# Patient Record
Sex: Female | Born: 1945 | Race: Black or African American | Hispanic: No | Marital: Single | Smoking: Former smoker
Health system: Southern US, Community
[De-identification: ages and names within clinical notes are randomized; demographics above are authoritative.]

## PROBLEM LIST (undated history)

## (undated) DIAGNOSIS — J069 Acute upper respiratory infection, unspecified: Secondary | ICD-10-CM

## (undated) DIAGNOSIS — K921 Melena: Secondary | ICD-10-CM

## (undated) DIAGNOSIS — J029 Acute pharyngitis, unspecified: Secondary | ICD-10-CM

## (undated) DIAGNOSIS — L03818 Cellulitis of other sites: Secondary | ICD-10-CM

## (undated) DIAGNOSIS — Z9889 Other specified postprocedural states: Secondary | ICD-10-CM

## (undated) DIAGNOSIS — E669 Obesity, unspecified: Secondary | ICD-10-CM

## (undated) DIAGNOSIS — E785 Hyperlipidemia, unspecified: Secondary | ICD-10-CM

## (undated) DIAGNOSIS — E119 Type 2 diabetes mellitus without complications: Secondary | ICD-10-CM

## (undated) DIAGNOSIS — R635 Abnormal weight gain: Secondary | ICD-10-CM

## (undated) DIAGNOSIS — M545 Low back pain: Secondary | ICD-10-CM

## (undated) DIAGNOSIS — L723 Sebaceous cyst: Secondary | ICD-10-CM

## (undated) DIAGNOSIS — Z8719 Personal history of other diseases of the digestive system: Secondary | ICD-10-CM

## (undated) DIAGNOSIS — L909 Atrophic disorder of skin, unspecified: Secondary | ICD-10-CM

## (undated) DIAGNOSIS — I10 Essential (primary) hypertension: Secondary | ICD-10-CM

## (undated) DIAGNOSIS — R51 Headache: Secondary | ICD-10-CM

## (undated) DIAGNOSIS — L02818 Cutaneous abscess of other sites: Secondary | ICD-10-CM

## (undated) DIAGNOSIS — D649 Anemia, unspecified: Secondary | ICD-10-CM

## (undated) DIAGNOSIS — J449 Chronic obstructive pulmonary disease, unspecified: Secondary | ICD-10-CM

## (undated) DIAGNOSIS — L919 Hypertrophic disorder of the skin, unspecified: Secondary | ICD-10-CM

## (undated) DIAGNOSIS — B373 Candidiasis of vulva and vagina: Secondary | ICD-10-CM

## (undated) DIAGNOSIS — B351 Tinea unguium: Secondary | ICD-10-CM

## (undated) HISTORY — DX: Melena: K92.1

## (undated) HISTORY — DX: Low back pain: M54.5

## (undated) HISTORY — DX: Acute pharyngitis, unspecified: J02.9

## (undated) HISTORY — DX: Sebaceous cyst: L72.3

## (undated) HISTORY — DX: Headache: R51

## (undated) HISTORY — DX: Tinea unguium: B35.1

## (undated) HISTORY — PX: BACK SURGERY: SHX140

## (undated) HISTORY — DX: Abnormal weight gain: R63.5

## (undated) HISTORY — DX: Other specified postprocedural states: Z98.890

## (undated) HISTORY — DX: Acute upper respiratory infection, unspecified: J06.9

## (undated) HISTORY — DX: Cutaneous abscess of other sites: L02.818

## (undated) HISTORY — DX: Hyperlipidemia, unspecified: E78.5

## (undated) HISTORY — DX: Cellulitis of other sites: L03.818

## (undated) HISTORY — DX: Type 2 diabetes mellitus without complications: E11.9

## (undated) HISTORY — DX: Candidiasis of vulva and vagina: B37.3

## (undated) HISTORY — DX: Obesity, unspecified: E66.9

## (undated) HISTORY — PX: ABDOMINAL HYSTERECTOMY: SHX81

## (undated) HISTORY — PX: EYE SURGERY: SHX253

## (undated) HISTORY — DX: Atrophic disorder of skin, unspecified: L90.9

## (undated) HISTORY — DX: Hypertrophic disorder of the skin, unspecified: L91.9

## (undated) HISTORY — DX: Personal history of other diseases of the digestive system: Z87.19

---

## 2001-10-28 ENCOUNTER — Ambulatory Visit (HOSPITAL_COMMUNITY): Admission: RE | Admit: 2001-10-28 | Discharge: 2001-10-28 | Payer: Self-pay | Admitting: *Deleted

## 2001-10-28 ENCOUNTER — Encounter: Payer: Self-pay | Admitting: *Deleted

## 2002-01-05 ENCOUNTER — Ambulatory Visit (HOSPITAL_COMMUNITY): Admission: RE | Admit: 2002-01-05 | Discharge: 2002-01-05 | Payer: Self-pay | Admitting: Family Medicine

## 2002-02-02 ENCOUNTER — Encounter: Payer: Self-pay | Admitting: *Deleted

## 2002-02-02 ENCOUNTER — Emergency Department (HOSPITAL_COMMUNITY): Admission: EM | Admit: 2002-02-02 | Discharge: 2002-02-02 | Payer: Self-pay | Admitting: *Deleted

## 2002-03-17 ENCOUNTER — Ambulatory Visit (HOSPITAL_COMMUNITY): Admission: RE | Admit: 2002-03-17 | Discharge: 2002-03-17 | Payer: Self-pay | Admitting: Internal Medicine

## 2002-03-17 ENCOUNTER — Encounter: Payer: Self-pay | Admitting: Internal Medicine

## 2002-04-21 ENCOUNTER — Encounter: Payer: Self-pay | Admitting: General Surgery

## 2002-04-28 ENCOUNTER — Inpatient Hospital Stay (HOSPITAL_COMMUNITY): Admission: RE | Admit: 2002-04-28 | Discharge: 2002-05-03 | Payer: Self-pay | Admitting: General Surgery

## 2002-04-28 ENCOUNTER — Encounter: Payer: Self-pay | Admitting: General Surgery

## 2002-04-28 ENCOUNTER — Encounter (INDEPENDENT_AMBULATORY_CARE_PROVIDER_SITE_OTHER): Payer: Self-pay | Admitting: Specialist

## 2003-03-02 ENCOUNTER — Ambulatory Visit (HOSPITAL_COMMUNITY): Admission: RE | Admit: 2003-03-02 | Discharge: 2003-03-02 | Payer: Self-pay | Admitting: Family Medicine

## 2004-07-17 ENCOUNTER — Ambulatory Visit (HOSPITAL_COMMUNITY): Admission: RE | Admit: 2004-07-17 | Discharge: 2004-07-17 | Payer: Self-pay | Admitting: Family Medicine

## 2004-09-22 ENCOUNTER — Ambulatory Visit: Payer: Self-pay | Admitting: *Deleted

## 2004-10-05 ENCOUNTER — Ambulatory Visit: Payer: Self-pay | Admitting: Family Medicine

## 2004-12-11 ENCOUNTER — Ambulatory Visit: Payer: Self-pay | Admitting: Family Medicine

## 2005-01-24 ENCOUNTER — Ambulatory Visit: Payer: Self-pay | Admitting: Family Medicine

## 2005-02-05 ENCOUNTER — Ambulatory Visit: Payer: Self-pay | Admitting: Family Medicine

## 2005-02-14 ENCOUNTER — Ambulatory Visit: Payer: Self-pay | Admitting: Family Medicine

## 2005-02-19 ENCOUNTER — Ambulatory Visit: Payer: Self-pay | Admitting: Family Medicine

## 2005-04-24 ENCOUNTER — Ambulatory Visit: Payer: Self-pay | Admitting: Internal Medicine

## 2005-04-25 ENCOUNTER — Ambulatory Visit: Payer: Self-pay | Admitting: Family Medicine

## 2005-08-29 ENCOUNTER — Ambulatory Visit: Payer: Self-pay | Admitting: Family Medicine

## 2005-09-27 ENCOUNTER — Ambulatory Visit: Payer: Self-pay | Admitting: Family Medicine

## 2005-11-28 ENCOUNTER — Ambulatory Visit: Payer: Self-pay | Admitting: Family Medicine

## 2005-12-11 ENCOUNTER — Ambulatory Visit (HOSPITAL_COMMUNITY): Admission: RE | Admit: 2005-12-11 | Discharge: 2005-12-11 | Payer: Self-pay | Admitting: Family Medicine

## 2006-02-27 ENCOUNTER — Ambulatory Visit: Payer: Self-pay | Admitting: Family Medicine

## 2006-04-08 ENCOUNTER — Ambulatory Visit: Payer: Self-pay | Admitting: Family Medicine

## 2006-04-25 ENCOUNTER — Ambulatory Visit: Payer: Self-pay | Admitting: Family Medicine

## 2006-06-19 ENCOUNTER — Ambulatory Visit: Payer: Self-pay | Admitting: Internal Medicine

## 2006-06-20 ENCOUNTER — Ambulatory Visit: Payer: Self-pay | Admitting: Family Medicine

## 2006-06-21 ENCOUNTER — Ambulatory Visit: Payer: Self-pay | Admitting: *Deleted

## 2006-08-15 ENCOUNTER — Ambulatory Visit: Payer: Self-pay | Admitting: Family Medicine

## 2006-08-22 ENCOUNTER — Ambulatory Visit: Payer: Self-pay | Admitting: Family Medicine

## 2006-09-10 ENCOUNTER — Ambulatory Visit: Payer: Self-pay | Admitting: Family Medicine

## 2006-09-23 ENCOUNTER — Ambulatory Visit: Payer: Self-pay | Admitting: Internal Medicine

## 2006-10-01 ENCOUNTER — Ambulatory Visit: Payer: Self-pay | Admitting: Family Medicine

## 2006-10-08 ENCOUNTER — Ambulatory Visit: Payer: Self-pay | Admitting: Family Medicine

## 2006-11-11 ENCOUNTER — Ambulatory Visit: Payer: Self-pay | Admitting: Family Medicine

## 2006-11-24 ENCOUNTER — Emergency Department (HOSPITAL_COMMUNITY): Admission: EM | Admit: 2006-11-24 | Discharge: 2006-11-24 | Payer: Self-pay | Admitting: Emergency Medicine

## 2006-11-25 ENCOUNTER — Ambulatory Visit: Payer: Self-pay | Admitting: Family Medicine

## 2006-12-16 ENCOUNTER — Ambulatory Visit (HOSPITAL_COMMUNITY): Admission: RE | Admit: 2006-12-16 | Discharge: 2006-12-16 | Payer: Self-pay | Admitting: Internal Medicine

## 2006-12-16 ENCOUNTER — Ambulatory Visit: Payer: Self-pay | Admitting: Family Medicine

## 2007-01-30 ENCOUNTER — Ambulatory Visit: Payer: Self-pay | Admitting: Family Medicine

## 2007-02-20 ENCOUNTER — Ambulatory Visit: Payer: Self-pay | Admitting: Family Medicine

## 2007-04-21 ENCOUNTER — Ambulatory Visit: Payer: Self-pay | Admitting: Family Medicine

## 2007-05-05 ENCOUNTER — Ambulatory Visit: Payer: Self-pay | Admitting: Family Medicine

## 2007-06-27 ENCOUNTER — Ambulatory Visit: Payer: Self-pay | Admitting: Internal Medicine

## 2007-07-21 ENCOUNTER — Ambulatory Visit: Payer: Self-pay | Admitting: Internal Medicine

## 2007-08-13 ENCOUNTER — Ambulatory Visit: Payer: Self-pay | Admitting: Family Medicine

## 2007-08-13 LAB — CONVERTED CEMR LAB
ALT: 15 units/L (ref 0–35)
AST: 16 units/L (ref 0–37)
BUN: 14 mg/dL (ref 6–23)
Basophils Absolute: 0.1 10*3/uL (ref 0.0–0.1)
Basophils Relative: 1 % (ref 0–1)
CO2: 29 meq/L (ref 19–32)
Calcium: 9.4 mg/dL (ref 8.4–10.5)
Chloride: 103 meq/L (ref 96–112)
Creatinine, Ser: 0.78 mg/dL (ref 0.40–1.20)
Eosinophils Relative: 2 % (ref 0–5)
Glucose, Bld: 98 mg/dL
HCT: 44.1 % (ref 36.0–46.0)
Hemoglobin: 13.6 g/dL (ref 12.0–15.0)
Hgb A1c MFr Bld: 5.8 %
LDL Cholesterol: 74 mg/dL (ref 0–99)
MCHC: 30.8 g/dL (ref 30.0–36.0)
Monocytes Absolute: 0.4 10*3/uL (ref 0.2–0.7)
Monocytes Relative: 7 % (ref 3–11)
Neutro Abs: 3.3 10*3/uL (ref 1.7–7.7)
RBC: 4.54 M/uL (ref 3.87–5.11)
RDW: 13.4 % (ref 11.5–14.0)
Total Bilirubin: 0.4 mg/dL (ref 0.3–1.2)
Triglycerides: 83 mg/dL (ref <150)

## 2007-08-26 DIAGNOSIS — Z8719 Personal history of other diseases of the digestive system: Secondary | ICD-10-CM

## 2007-08-26 DIAGNOSIS — I10 Essential (primary) hypertension: Secondary | ICD-10-CM | POA: Insufficient documentation

## 2007-08-26 DIAGNOSIS — E119 Type 2 diabetes mellitus without complications: Secondary | ICD-10-CM

## 2007-08-26 DIAGNOSIS — Z9889 Other specified postprocedural states: Secondary | ICD-10-CM

## 2007-08-26 DIAGNOSIS — E785 Hyperlipidemia, unspecified: Secondary | ICD-10-CM

## 2007-08-26 HISTORY — DX: Personal history of other diseases of the digestive system: Z87.19

## 2007-08-26 HISTORY — DX: Hyperlipidemia, unspecified: E78.5

## 2007-08-26 HISTORY — DX: Other specified postprocedural states: Z98.890

## 2007-08-26 HISTORY — DX: Type 2 diabetes mellitus without complications: E11.9

## 2007-09-17 ENCOUNTER — Encounter (INDEPENDENT_AMBULATORY_CARE_PROVIDER_SITE_OTHER): Payer: Self-pay | Admitting: *Deleted

## 2007-09-29 ENCOUNTER — Telehealth (INDEPENDENT_AMBULATORY_CARE_PROVIDER_SITE_OTHER): Payer: Self-pay | Admitting: Family Medicine

## 2007-10-27 ENCOUNTER — Telehealth (INDEPENDENT_AMBULATORY_CARE_PROVIDER_SITE_OTHER): Payer: Self-pay | Admitting: Internal Medicine

## 2007-11-05 ENCOUNTER — Ambulatory Visit: Payer: Self-pay | Admitting: Family Medicine

## 2007-11-05 DIAGNOSIS — R519 Headache, unspecified: Secondary | ICD-10-CM | POA: Insufficient documentation

## 2007-11-05 DIAGNOSIS — R51 Headache: Secondary | ICD-10-CM

## 2007-11-05 HISTORY — DX: Headache: R51

## 2007-11-05 LAB — CONVERTED CEMR LAB: Microalb, Ur: 0.7 mg/dL (ref 0.00–1.89)

## 2007-12-15 ENCOUNTER — Ambulatory Visit: Payer: Self-pay | Admitting: Internal Medicine

## 2007-12-15 ENCOUNTER — Encounter (INDEPENDENT_AMBULATORY_CARE_PROVIDER_SITE_OTHER): Payer: Self-pay | Admitting: Family Medicine

## 2007-12-18 ENCOUNTER — Ambulatory Visit (HOSPITAL_COMMUNITY): Admission: RE | Admit: 2007-12-18 | Discharge: 2007-12-18 | Payer: Self-pay | Admitting: Family Medicine

## 2008-01-26 ENCOUNTER — Ambulatory Visit: Payer: Self-pay | Admitting: Family Medicine

## 2008-01-26 DIAGNOSIS — L02818 Cutaneous abscess of other sites: Secondary | ICD-10-CM

## 2008-01-26 DIAGNOSIS — L03818 Cellulitis of other sites: Secondary | ICD-10-CM

## 2008-01-26 DIAGNOSIS — L723 Sebaceous cyst: Secondary | ICD-10-CM | POA: Insufficient documentation

## 2008-01-26 HISTORY — DX: Cellulitis of other sites: L03.818

## 2008-01-26 HISTORY — DX: Cutaneous abscess of other sites: L02.818

## 2008-01-26 HISTORY — DX: Sebaceous cyst: L72.3

## 2008-01-26 LAB — CONVERTED CEMR LAB: Blood Glucose, Fingerstick: 94

## 2008-01-28 ENCOUNTER — Ambulatory Visit: Payer: Self-pay | Admitting: Family Medicine

## 2008-01-30 ENCOUNTER — Telehealth (INDEPENDENT_AMBULATORY_CARE_PROVIDER_SITE_OTHER): Payer: Self-pay | Admitting: *Deleted

## 2008-02-02 ENCOUNTER — Ambulatory Visit: Payer: Self-pay | Admitting: Family Medicine

## 2008-05-17 ENCOUNTER — Telehealth (INDEPENDENT_AMBULATORY_CARE_PROVIDER_SITE_OTHER): Payer: Self-pay | Admitting: Family Medicine

## 2008-05-19 ENCOUNTER — Encounter (INDEPENDENT_AMBULATORY_CARE_PROVIDER_SITE_OTHER): Payer: Self-pay | Admitting: Family Medicine

## 2008-05-19 ENCOUNTER — Ambulatory Visit: Payer: Self-pay | Admitting: Internal Medicine

## 2008-06-15 ENCOUNTER — Ambulatory Visit: Payer: Self-pay | Admitting: Family Medicine

## 2008-06-15 DIAGNOSIS — R635 Abnormal weight gain: Secondary | ICD-10-CM | POA: Insufficient documentation

## 2008-06-15 HISTORY — DX: Abnormal weight gain: R63.5

## 2008-06-15 LAB — CONVERTED CEMR LAB
AST: 12 units/L (ref 0–37)
Albumin: 4.3 g/dL (ref 3.5–5.2)
Alkaline Phosphatase: 85 units/L (ref 39–117)
Basophils Absolute: 0.1 10*3/uL (ref 0.0–0.1)
Basophils Relative: 1 % (ref 0–1)
Eosinophils Absolute: 0.1 10*3/uL (ref 0.0–0.7)
LDL Cholesterol: 80 mg/dL (ref 0–99)
MCHC: 30.3 g/dL (ref 30.0–36.0)
MCV: 97.7 fL (ref 78.0–100.0)
Neutrophils Relative %: 51 % (ref 43–77)
Platelets: 288 10*3/uL (ref 150–400)
Potassium: 4.7 meq/L (ref 3.5–5.3)
RDW: 13.2 % (ref 11.5–15.5)
Sodium: 147 meq/L — ABNORMAL HIGH (ref 135–145)
TSH: 0.693 microintl units/mL (ref 0.350–5.50)
Total Bilirubin: 0.4 mg/dL (ref 0.3–1.2)
Total Protein: 7.2 g/dL (ref 6.0–8.3)
VLDL: 12 mg/dL (ref 0–40)
WBC: 5.7 10*3/uL (ref 4.0–10.5)

## 2008-08-10 ENCOUNTER — Ambulatory Visit: Payer: Self-pay | Admitting: Family Medicine

## 2008-08-10 DIAGNOSIS — J069 Acute upper respiratory infection, unspecified: Secondary | ICD-10-CM | POA: Insufficient documentation

## 2008-08-10 HISTORY — DX: Acute upper respiratory infection, unspecified: J06.9

## 2008-08-20 ENCOUNTER — Ambulatory Visit: Payer: Self-pay | Admitting: Internal Medicine

## 2008-08-23 ENCOUNTER — Encounter (INDEPENDENT_AMBULATORY_CARE_PROVIDER_SITE_OTHER): Payer: Self-pay | Admitting: Family Medicine

## 2008-09-08 ENCOUNTER — Telehealth (INDEPENDENT_AMBULATORY_CARE_PROVIDER_SITE_OTHER): Payer: Self-pay | Admitting: Family Medicine

## 2008-09-15 ENCOUNTER — Ambulatory Visit: Payer: Self-pay | Admitting: Internal Medicine

## 2008-09-15 ENCOUNTER — Encounter (INDEPENDENT_AMBULATORY_CARE_PROVIDER_SITE_OTHER): Payer: Self-pay | Admitting: Family Medicine

## 2008-09-28 ENCOUNTER — Ambulatory Visit: Payer: Self-pay | Admitting: Family Medicine

## 2008-09-28 LAB — CONVERTED CEMR LAB: Blood Glucose, Fingerstick: 125

## 2008-11-03 ENCOUNTER — Encounter (INDEPENDENT_AMBULATORY_CARE_PROVIDER_SITE_OTHER): Payer: Self-pay | Admitting: Family Medicine

## 2008-12-06 ENCOUNTER — Telehealth (INDEPENDENT_AMBULATORY_CARE_PROVIDER_SITE_OTHER): Payer: Self-pay | Admitting: Family Medicine

## 2008-12-07 ENCOUNTER — Ambulatory Visit: Payer: Self-pay | Admitting: Family Medicine

## 2008-12-07 DIAGNOSIS — M545 Low back pain, unspecified: Secondary | ICD-10-CM | POA: Insufficient documentation

## 2008-12-07 HISTORY — DX: Low back pain, unspecified: M54.50

## 2008-12-07 LAB — CONVERTED CEMR LAB: Blood Glucose, Fingerstick: 110

## 2009-01-19 ENCOUNTER — Encounter (INDEPENDENT_AMBULATORY_CARE_PROVIDER_SITE_OTHER): Payer: Self-pay | Admitting: Family Medicine

## 2009-01-19 ENCOUNTER — Ambulatory Visit: Payer: Self-pay | Admitting: Internal Medicine

## 2009-01-19 ENCOUNTER — Ambulatory Visit (HOSPITAL_COMMUNITY): Admission: RE | Admit: 2009-01-19 | Discharge: 2009-01-19 | Payer: Self-pay | Admitting: Internal Medicine

## 2009-03-17 ENCOUNTER — Ambulatory Visit: Payer: Self-pay | Admitting: Family Medicine

## 2009-04-20 ENCOUNTER — Ambulatory Visit: Payer: Self-pay | Admitting: Internal Medicine

## 2009-04-20 ENCOUNTER — Encounter (INDEPENDENT_AMBULATORY_CARE_PROVIDER_SITE_OTHER): Payer: Self-pay | Admitting: Family Medicine

## 2009-04-25 ENCOUNTER — Ambulatory Visit: Payer: Self-pay | Admitting: Internal Medicine

## 2009-06-02 ENCOUNTER — Encounter (INDEPENDENT_AMBULATORY_CARE_PROVIDER_SITE_OTHER): Payer: Self-pay | Admitting: Nurse Practitioner

## 2009-06-03 ENCOUNTER — Encounter (INDEPENDENT_AMBULATORY_CARE_PROVIDER_SITE_OTHER): Payer: Self-pay | Admitting: Family Medicine

## 2009-06-29 ENCOUNTER — Ambulatory Visit: Payer: Self-pay | Admitting: Physician Assistant

## 2009-06-29 LAB — CONVERTED CEMR LAB: Hgb A1c MFr Bld: 7.2 %

## 2009-07-20 ENCOUNTER — Ambulatory Visit: Payer: Self-pay | Admitting: Internal Medicine

## 2009-07-20 ENCOUNTER — Ambulatory Visit: Payer: Self-pay | Admitting: Physician Assistant

## 2009-07-21 ENCOUNTER — Encounter (INDEPENDENT_AMBULATORY_CARE_PROVIDER_SITE_OTHER): Payer: Self-pay | Admitting: Family Medicine

## 2009-08-10 ENCOUNTER — Ambulatory Visit: Payer: Self-pay | Admitting: Family Medicine

## 2009-08-10 ENCOUNTER — Encounter: Payer: Self-pay | Admitting: Physician Assistant

## 2009-08-12 ENCOUNTER — Encounter: Payer: Self-pay | Admitting: Physician Assistant

## 2009-08-12 LAB — CONVERTED CEMR LAB
AST: 15 units/L (ref 0–37)
Albumin: 4.1 g/dL (ref 3.5–5.2)
BUN: 12 mg/dL (ref 6–23)
Calcium: 9.6 mg/dL (ref 8.4–10.5)
Chloride: 103 meq/L (ref 96–112)
Potassium: 4.5 meq/L (ref 3.5–5.3)
Sodium: 143 meq/L (ref 135–145)
Total Protein: 6.9 g/dL (ref 6.0–8.3)

## 2009-08-15 ENCOUNTER — Encounter (INDEPENDENT_AMBULATORY_CARE_PROVIDER_SITE_OTHER): Payer: Self-pay | Admitting: Family Medicine

## 2009-08-31 ENCOUNTER — Encounter: Payer: Self-pay | Admitting: Physician Assistant

## 2009-08-31 ENCOUNTER — Ambulatory Visit: Payer: Self-pay | Admitting: Nurse Practitioner

## 2009-08-31 LAB — CONVERTED CEMR LAB
BUN: 19 mg/dL (ref 6–23)
Calcium: 9.5 mg/dL (ref 8.4–10.5)
Creatinine, Ser: 0.75 mg/dL (ref 0.40–1.20)
Glucose, Bld: 161 mg/dL — ABNORMAL HIGH (ref 70–99)
Potassium: 4.5 meq/L (ref 3.5–5.3)

## 2009-09-02 ENCOUNTER — Encounter: Payer: Self-pay | Admitting: Physician Assistant

## 2009-10-19 ENCOUNTER — Encounter: Payer: Self-pay | Admitting: Physician Assistant

## 2009-10-19 ENCOUNTER — Ambulatory Visit: Payer: Self-pay | Admitting: Internal Medicine

## 2009-10-26 DIAGNOSIS — B351 Tinea unguium: Secondary | ICD-10-CM

## 2009-10-26 HISTORY — DX: Tinea unguium: B35.1

## 2009-11-16 ENCOUNTER — Telehealth: Payer: Self-pay | Admitting: Physician Assistant

## 2009-11-29 ENCOUNTER — Ambulatory Visit: Payer: Self-pay | Admitting: Physician Assistant

## 2009-11-29 LAB — CONVERTED CEMR LAB
Blood Glucose, Fingerstick: 92
Hgb A1c MFr Bld: 7.3 %

## 2009-12-19 ENCOUNTER — Ambulatory Visit: Payer: Self-pay | Admitting: Physician Assistant

## 2009-12-19 DIAGNOSIS — L919 Hypertrophic disorder of the skin, unspecified: Secondary | ICD-10-CM

## 2009-12-19 DIAGNOSIS — L909 Atrophic disorder of skin, unspecified: Secondary | ICD-10-CM

## 2009-12-19 HISTORY — DX: Atrophic disorder of skin, unspecified: L90.9

## 2010-01-18 ENCOUNTER — Ambulatory Visit: Payer: Self-pay | Admitting: Internal Medicine

## 2010-01-18 ENCOUNTER — Encounter: Payer: Self-pay | Admitting: Physician Assistant

## 2010-01-19 ENCOUNTER — Telehealth: Payer: Self-pay | Admitting: Physician Assistant

## 2010-01-20 ENCOUNTER — Ambulatory Visit (HOSPITAL_COMMUNITY): Admission: RE | Admit: 2010-01-20 | Discharge: 2010-01-20 | Payer: Self-pay | Admitting: Internal Medicine

## 2010-02-28 ENCOUNTER — Ambulatory Visit: Payer: Self-pay | Admitting: Physician Assistant

## 2010-02-28 DIAGNOSIS — B373 Candidiasis of vulva and vagina: Secondary | ICD-10-CM

## 2010-02-28 DIAGNOSIS — B3731 Acute candidiasis of vulva and vagina: Secondary | ICD-10-CM

## 2010-02-28 HISTORY — DX: Acute candidiasis of vulva and vagina: B37.31

## 2010-02-28 HISTORY — DX: Candidiasis of vulva and vagina: B37.3

## 2010-02-28 LAB — CONVERTED CEMR LAB
ALT: 14 units/L (ref 0–35)
Basophils Relative: 1 % (ref 0–1)
Blood in Urine, dipstick: NEGATIVE
CO2: 27 meq/L (ref 19–32)
Calcium: 9.6 mg/dL (ref 8.4–10.5)
Chloride: 101 meq/L (ref 96–112)
Cholesterol: 132 mg/dL (ref 0–200)
Creatinine, Ser: 0.66 mg/dL (ref 0.40–1.20)
Eosinophils Absolute: 0.1 10*3/uL (ref 0.0–0.7)
Glucose, Bld: 128 mg/dL — ABNORMAL HIGH (ref 70–99)
Glucose, Urine, Semiquant: NEGATIVE
HCT: 40 % (ref 36.0–46.0)
Hemoglobin: 12.1 g/dL (ref 12.0–15.0)
Lymphs Abs: 2.4 10*3/uL (ref 0.7–4.0)
MCHC: 30.3 g/dL (ref 30.0–36.0)
MCV: 95.2 fL (ref 78.0–100.0)
Monocytes Absolute: 0.5 10*3/uL (ref 0.1–1.0)
Monocytes Relative: 7 % (ref 3–12)
Neutrophils Relative %: 56 % (ref 43–77)
Nitrite: NEGATIVE
Protein, U semiquant: NEGATIVE
RBC: 4.2 M/uL (ref 3.87–5.11)
Specific Gravity, Urine: >=1.03
Total Bilirubin: 0.5 mg/dL (ref 0.3–1.2)
Total CHOL/HDL Ratio: 3.1
Total Protein: 7.2 g/dL (ref 6.0–8.3)
Triglycerides: 88 mg/dL (ref <150)
Urobilinogen, UA: 0.2
WBC Urine, dipstick: NEGATIVE
WBC: 6.8 10*3/uL (ref 4.0–10.5)

## 2010-03-02 ENCOUNTER — Encounter: Payer: Self-pay | Admitting: Physician Assistant

## 2010-03-03 ENCOUNTER — Encounter: Payer: Self-pay | Admitting: Physician Assistant

## 2010-03-03 LAB — CONVERTED CEMR LAB: Pap Smear: NEGATIVE

## 2010-03-24 ENCOUNTER — Telehealth: Payer: Self-pay | Admitting: Physician Assistant

## 2010-04-19 ENCOUNTER — Encounter: Payer: Self-pay | Admitting: Physician Assistant

## 2010-04-19 ENCOUNTER — Ambulatory Visit: Payer: Self-pay | Admitting: Internal Medicine

## 2010-04-24 ENCOUNTER — Telehealth: Payer: Self-pay | Admitting: Physician Assistant

## 2010-05-04 ENCOUNTER — Ambulatory Visit: Payer: Self-pay | Admitting: Physician Assistant

## 2010-05-04 LAB — CONVERTED CEMR LAB: Hgb A1c MFr Bld: 6.5 %

## 2010-07-24 ENCOUNTER — Ambulatory Visit: Payer: Self-pay | Admitting: Internal Medicine

## 2010-07-24 ENCOUNTER — Encounter: Payer: Self-pay | Admitting: Physician Assistant

## 2010-08-10 ENCOUNTER — Ambulatory Visit: Payer: Self-pay | Admitting: Physician Assistant

## 2010-08-10 LAB — CONVERTED CEMR LAB
ALT: 14 units/L (ref 0–35)
AST: 12 units/L (ref 0–37)
Alkaline Phosphatase: 96 units/L (ref 39–117)
Bilirubin Urine: NEGATIVE
Blood Glucose, Fingerstick: 108
Creatinine, Ser: 0.7 mg/dL (ref 0.40–1.20)
Glucose, Urine, Semiquant: NEGATIVE
Hgb A1c MFr Bld: 6.9 %
Sodium: 143 meq/L (ref 135–145)
Specific Gravity, Urine: >=1.03
Total Bilirubin: 0.3 mg/dL (ref 0.3–1.2)
WBC Urine, dipstick: NEGATIVE
pH: 5

## 2010-08-11 ENCOUNTER — Encounter: Payer: Self-pay | Admitting: Physician Assistant

## 2010-08-22 ENCOUNTER — Telehealth: Payer: Self-pay | Admitting: Physician Assistant

## 2010-08-29 ENCOUNTER — Ambulatory Visit: Payer: Self-pay | Admitting: Physician Assistant

## 2010-08-30 LAB — CONVERTED CEMR LAB
Basophils Relative: 1 % (ref 0–1)
Eosinophils Absolute: 0 10*3/uL (ref 0.0–0.7)
Eosinophils Relative: 0 % (ref 0–5)
HCT: 40.6 % (ref 36.0–46.0)
Lymphs Abs: 2.1 10*3/uL (ref 0.7–4.0)
MCHC: 30 g/dL (ref 30.0–36.0)
MCV: 95.3 fL (ref 78.0–100.0)
Platelets: 384 10*3/uL (ref 150–400)
RDW: 13.6 % (ref 11.5–15.5)
WBC: 7.2 10*3/uL (ref 4.0–10.5)

## 2010-08-31 ENCOUNTER — Telehealth: Payer: Self-pay | Admitting: Physician Assistant

## 2010-09-01 ENCOUNTER — Telehealth: Payer: Self-pay | Admitting: Physician Assistant

## 2010-09-25 ENCOUNTER — Telehealth: Payer: Self-pay | Admitting: Physician Assistant

## 2010-10-04 ENCOUNTER — Ambulatory Visit (HOSPITAL_COMMUNITY): Admission: RE | Admit: 2010-10-04 | Discharge: 2010-10-04 | Payer: Self-pay | Admitting: Gastroenterology

## 2010-10-06 ENCOUNTER — Encounter (INDEPENDENT_AMBULATORY_CARE_PROVIDER_SITE_OTHER): Payer: Self-pay | Admitting: Internal Medicine

## 2010-10-06 DIAGNOSIS — K921 Melena: Secondary | ICD-10-CM

## 2010-10-06 HISTORY — DX: Melena: K92.1

## 2010-10-18 ENCOUNTER — Ambulatory Visit: Payer: Self-pay | Admitting: Internal Medicine

## 2010-10-26 ENCOUNTER — Ambulatory Visit: Payer: Self-pay | Admitting: Physician Assistant

## 2010-10-26 ENCOUNTER — Encounter (INDEPENDENT_AMBULATORY_CARE_PROVIDER_SITE_OTHER): Payer: Self-pay | Admitting: Nurse Practitioner

## 2010-11-07 LAB — CONVERTED CEMR LAB
Basophils Relative: 1 % (ref 0–1)
Eosinophils Absolute: 0.1 10*3/uL (ref 0.0–0.7)
Lymphs Abs: 2.3 10*3/uL (ref 0.7–4.0)
MCV: 95.7 fL (ref 78.0–100.0)
Neutrophils Relative %: 58 % (ref 43–77)
Platelets: 345 10*3/uL (ref 150–400)
WBC: 7.3 10*3/uL (ref 4.0–10.5)

## 2010-12-21 ENCOUNTER — Telehealth: Payer: Self-pay | Admitting: Physician Assistant

## 2010-12-21 ENCOUNTER — Ambulatory Visit: Payer: Self-pay | Admitting: Nurse Practitioner

## 2010-12-21 DIAGNOSIS — J029 Acute pharyngitis, unspecified: Secondary | ICD-10-CM

## 2010-12-21 HISTORY — DX: Acute pharyngitis, unspecified: J02.9

## 2010-12-21 LAB — CONVERTED CEMR LAB: Blood Glucose, Fingerstick: 111

## 2011-01-10 ENCOUNTER — Ambulatory Visit: Admit: 2011-01-10 | Payer: Self-pay | Admitting: Nurse Practitioner

## 2011-01-22 ENCOUNTER — Ambulatory Visit (HOSPITAL_COMMUNITY)
Admission: RE | Admit: 2011-01-22 | Discharge: 2011-01-22 | Payer: Self-pay | Source: Home / Self Care | Attending: Internal Medicine | Admitting: Internal Medicine

## 2011-01-29 ENCOUNTER — Encounter (INDEPENDENT_AMBULATORY_CARE_PROVIDER_SITE_OTHER): Payer: Self-pay | Admitting: Internal Medicine

## 2011-01-30 NOTE — Letter (Signed)
Summary: PODIATRY  PODIATRY   Imported By: Arta Bruce 08/11/2010 15:19:12  _____________________________________________________________________  External Attachment:    Type:   Image     Comment:   External Document

## 2011-01-30 NOTE — Progress Notes (Signed)
Summary: Office Visit - Podiatry  Office Visit - Podiatry   Imported By: Paula Libra 01/19/2010 10:09:43  _____________________________________________________________________  External Attachment:    Type:   Image     Comment:   External Document

## 2011-01-30 NOTE — Progress Notes (Signed)
Summary: COLONOSCOPY RX   Medications Added MIRALAX  POWD (POLYETHYLENE GLYCOL 3350) as per colonoscopy prep instructions       Phone Note Outgoing Call   Summary of Call: I SCHEDULE AN APPT 10-04-10 @ 2:15PM WITH DR  Victorino Dike PT WILL PASS BY FOR HER PRESCRIPTION & INSTRUCTIONS  THANK YOU  Initial call taken by: Cheryll Dessert,  August 31, 2010 3:09 PM  Follow-up for Phone Call        scott, pt need script for mirlax Follow-up by: Armenia Shannon,  August 31, 2010 5:05 PM  Additional Follow-up for Phone Call Additional follow up Details #1::        rx in basket to fax  Additional Follow-up by: Brynda Rim,  August 31, 2010 5:07 PM    Additional Follow-up for Phone Call Additional follow up Details #2::    give script to [facility] Follow-up by: Armenia Shannon,  September 01, 2010 11:16 AM  New/Updated Medications: MIRALAX  POWD (POLYETHYLENE GLYCOL 3350) as per colonoscopy prep instructions Prescriptions: MIRALAX  POWD (POLYETHYLENE GLYCOL 3350) as per colonoscopy prep instructions  #1 bottle x 0   Entered and Authorized by:   Tereso Newcomer PA-C   Signed by:   Tereso Newcomer PA-C on 08/31/2010   Method used:   Printed then faxed to ...       [facility]       [address]       [place], [place]  [address]       Ph: [phone]       Fax: [phone]   RxID:   [id number]

## 2011-01-30 NOTE — Letter (Signed)
Summary: *HSN Results Follow up  [facility]  [address] [place], [place] [address]   Phone: [phone]  Fax: [phone]      08/11/2010   Odyssey Rackers [address] [place], [place]  [address]   Dear  Ms. Talissa Crist,                            ____S.Drinkard,FNP   ____D. Gore,FNP       ____B. McPherson,MD   ____V. Rankins,MD    ____E. Mulberry,MD    ____N. Daphine Deutscher, FNP  ____D. Reche Dixon, MD    ____K. Philipp Deputy, MD    _x___S. Alben Spittle, PA-C     This letter is to inform you that your recent test(s):  _______Pap Smear    ___x____Lab Test     _______X-ray    ___x____ is within acceptable limits  _______ requires a medication change  _______ requires a follow-up lab visit  _______ requires a follow-up visit with your provider   Comments:  Kidney and liver function is normal.       _________________________________________________________ If you have any questions, please contact our office                     Sincerely,  Tereso Newcomer PA-C [facility]

## 2011-01-30 NOTE — Letter (Signed)
Summary: *HSN Results Follow up  [facility]  [address] [place], [place] [address]   Phone: [phone]  Fax: [phone]      03/02/2010   Leoma Buonocore [address] [place], [place]  [address]   Dear  Ms. Solei Dimercurio,                            ____S.Drinkard,FNP   ____D. Gore,FNP       ____B. McPherson,MD   ____V. Rankins,MD    ____E. Mulberry,MD    ____N. Daphine Deutscher, FNP  ____D. Reche Dixon, MD    ____K. Philipp Deputy, MD    __x__S. Alben Spittle, PA-C     This letter is to inform you that your recent test(s):  _______Pap Smear    ___x____Lab Test     _______X-ray    ___x____ is within acceptable limits  _______ requires a medication change  _______ requires a follow-up lab visit  _______ requires a follow-up visit with your provider   Comments: Blood counts, kidney function, thyroid all normal.  Cholesterol looks good.       _________________________________________________________ If you have any questions, please contact our office                     Sincerely,  Tereso Newcomer PA-C [facility]

## 2011-01-30 NOTE — Progress Notes (Signed)
Summary: Needs Wet Prep   Phone Note Outgoing Call   Summary of Call: Requesting diflucan. Will need to come in for a wet prep to confirm yeast infection.  Initial call taken by: Tereso Newcomer PA-C,  September 01, 2010 3:42 PM  Follow-up for Phone Call        pt says she did not request anything for yeast but she did for a cholestrol Follow-up by: Armenia Shannon,  September 05, 2010 12:05 PM

## 2011-01-30 NOTE — Letter (Signed)
Summary: *HSN Results Follow up  [facility]  [address] [place], [place] [address]   Phone: [phone]  Fax: [phone]      03/03/2010   [name] [address] [place], [place]  [address]   Dear  Ms. Jessica Jacobs,                            ____S.Drinkard,FNP   ____D. Gore,FNP       ____B. McPherson,MD   ____V. Rankins,MD    ____E. Mulberry,MD    ____N. Daphine Deutscher, FNP  ____D. Reche Dixon, MD    ____K. Philipp Deputy, MD    __x__S. Alben Spittle, PA-C     This letter is to inform you that your recent test(s):  ___x____Pap Smear    _______Lab Test     _______X-ray    ___x____ is within acceptable limits  _______ requires a medication change  _______ requires a follow-up lab visit  _______ requires a follow-up visit with your provider   Comments:       _________________________________________________________ If you have any questions, please contact our office                     Sincerely,  Tereso Newcomer PA-C [facility]

## 2011-01-30 NOTE — Progress Notes (Signed)
Summary: Lab Results and Referral   Phone Note Outgoing Call   Summary of Call: Stool positive for blood. She needs to come in for CBC this week. Also, needs referral to GI for colonoscopy. Please notify patient.  Then send to [facility].  Referral in basket. If Dr. Doreatha Martin is available, send to him.  Initial call taken by: Brynda Rim,  August 22, 2010 5:51 PM  Follow-up for Phone Call        Left message on answering machine for pt. to return call.  Dutch Quint RN  August 23, 2010 11:04 AM   Additional Follow-up for Phone Call Additional follow up Details #1::        Pt scheduled for cbc, but she does not want to have a colonoscopy.  She said she had one 7 years ago and does not have the money to go. Additional Follow-up by: Vesta Mixer CMA,  August 23, 2010 2:51 PM  New Problems: GUAIAC POSITIVE STOOL (ICD-578.1)   Additional Follow-up for Phone Call Additional follow up Details #2::    I spoke to patient.  She is mainly concerned about cost. Please try to get her in for Dr. Doreatha Martin. Tereso Newcomer PA-C  August 24, 2010 5:40 PM   ok... Armenia Shannon  August 25, 2010 11:14 AM   New Problems: GUAIAC POSITIVE STOOL (ICD-578.1)    Impression & Recommendations:  Problem # 1:  GUAIAC POSITIVE STOOL (ICD-578.1)  check CBC refer to GI for colo  Orders: Gastroenterology Referral (GI)  Complete Medication List: 1)  Lisinopril 40 Mg Tabs (Lisinopril) .... Take 1 tablet by mouth once a day 2)  Metformin Hcl 1000 Mg Tabs (Metformin hcl) .... Take 1 tablet by mouth two times a day 3)  Crestor 5 Mg Tabs (Rosuvastatin calcium) .Marland Kitchen.. 1 by mouth qday 4)  Adult Aspirin Ec Low Strength 81 Mg Tbec (Aspirin) .Marland Kitchen.. 1 by mouth qday 5)  Toprol Xl 50 Mg Tb24 (Metoprolol succinate) .... Take 1 tablet by mouth every morning for blood pressure. 6)  Zithromax 250 Mg Tabs (Azithromycin) .... Take 2 tablets by mouth today and then 1 pill each day on days 2-5 7)  Tessalon Perles 100 Mg Caps  (Benzonatate) .... Take 1 tablet by mouth every 8 hours as needed cough 8)  Proventil Hfa 108 (90 Base) Mcg/act Aers (Albuterol sulfate) .... Use 2 puffs every 4-6 hours for cough 9)  Naprosyn 500 Mg Tabs (Naproxen) .Marland Kitchen.. 1 tablet by mouth every 12 hours as needed for back pain take with food 10)  Micardis 40 Mg Tabs (Telmisartan) .... Take 1 tablet by mouth two times a day 11)  Hydrochlorothiazide 25 Mg Tabs (Hydrochlorothiazide) .... Half (1/2) a tablet once daily 12)  Diflucan 150 Mg Tabs (Fluconazole) .... Take one by mouth

## 2011-01-30 NOTE — Assessment & Plan Note (Signed)
Summary: CPP////cns   Vital Signs:  Patient profile:   65 year old female Height:      65 inches Weight:      202 pounds BMI:     33.74 Temp:     97.4 degrees F oral Pulse rate:   86 / minute Pulse rhythm:   regular Resp:     18 per minute BP sitting:   126 / 80  (left arm) Cuff size:   regular  Vitals Entered By: Armenia Shannon (February 28, 2010 8:58 AM) CC: CPP.... pt says she wake up thirsty....., Hypertension Management Is Patient Diabetic? Yes CBG Result 123  Does patient need assistance? Functional Status Self care Ambulation Normal   CC:  CPP.... pt says she wake up thirsty..... and Hypertension Management.  History of Present Illness: Here for CPP.  Had TAH/BSO in 1990s.  She had fibroids.  No cancer.  No vaginal smear in many years. No vaginal bleeding. She is c/o vaginal discharge and odor ("fishy").  No burning or itching. Not sexually active. Mammo done in Jan and was ok. Last colo prob done 2003 before sigmoid colectomy.   She is not taking calcium. No FHx Breast Cancer or Ovarian Cancer.     Hypertension History:      She complains of dyspnea with exertion, but denies chest pain and syncope.  She notes no problems with any antihypertensive medication side effects.  Further comments include: DOE mild; noticed with increased weight; no assoc symptoms.        Positive major cardiovascular risk factors include female age 110 years old or older, diabetes, hyperlipidemia, and hypertension.  Negative major cardiovascular risk factors include negative family history for ischemic heart disease and non-tobacco-user status.     Habits & Providers  Alcohol-Tobacco-Diet     Alcohol drinks/day: 0     Tobacco Status: quit  Exercise-Depression-Behavior     Have you felt down or hopeless? no     Have you felt little pleasure in things? yes     Drug Use: never     Seat Belt Use: always  Problems Prior to Update: 1)  Vaginitis, Candidal  (ICD-112.1) 2)  Preventive  Health Care  (ICD-V70.0) 3)  Skin Tag  (ICD-701.9) 4)  Onychomycosis  (ICD-110.1) 5)  Back Pain, Lumbar  (ICD-724.2) 6)  Uri  (ICD-465.9) 7)  Weight Gain  (ICD-783.1) 8)  Cellulitis and Abscess of Other Specified Site  (ICD-682.8) 9)  Sebaceous Cyst, Infected  (ICD-706.2) 10)  Headache  (ICD-784.0) 11)  Colectomy, Partial, With Anastomosis, Hx of  (ICD-V15.2) 12)  Hyperlipidemia  (ICD-272.4) 13)  Diabetes Mellitus, Type II  (ICD-250.00) 14)  Diverticulitis, Hx of  (ICD-V12.79) 15)  Hypertension  (ICD-401.9)  Allergies: No Known Drug Allergies  Past History:  Past Surgical History: Hysterectomy (TAH/BSO) Lumbar disc rupture Colonic Stricture    s/p sigmoid colectomy 2/2 diverticulitis and stricture 2003  Family History: unremarkable  Social History: Reviewed history from 11/05/2007 and no changes required. Occupation:hotel housekeeper Single Former Smoker Alcohol use-no Drug use-no Has GED.Can read.Drug Use:  never Seat Belt Use:  always  Review of Systems      See HPI General:  Denies chills and fever. ENT:  Denies sore throat. CV:  Complains of shortness of breath with exertion; denies chest pain or discomfort, difficulty breathing at night, difficulty breathing while lying down, fainting, and swelling of feet. Resp:  Denies cough. GI:  Denies bloody stools and dark tarry stools. GU:  Denies dysuria.  Psych:  Denies depression and suicidal thoughts/plans. Endo:  Denies cold intolerance and heat intolerance.  Physical Exam  General:  alert, well-developed, and well-nourished.   Head:  normocephalic and atraumatic.   Eyes:  pupils equal, pupils round, pupils reactive to light, and no retinal abnormalitiies.   Ears:  R ear normal and L ear normal.   Nose:  no external deformity.   Mouth:  pharynx pink and moist.   Neck:  supple, no thyromegaly, no JVD, no carotid bruits, and no cervical lymphadenopathy.   Breasts:  skin/areolae normal, no masses, no abnormal  thickening, no nipple discharge, no tenderness, and no adenopathy.   Lungs:  normal breath sounds, no crackles, and no wheezes.   Heart:  normal rate, regular rhythm, and no murmur.   Abdomen:  soft, non-tender, normal bowel sounds, and no hepatomegaly.   Rectal:  no external abnormalities, normal sphincter tone, no masses, and external hemorrhoid(s).   Genitalia:  normal introitus, no external lesions, no vaginal discharge, and mucosa pink and moist.   cervix absent cuff intact no manual exam as pt s/p BSO Msk:  normal ROM.   Pulses:  DP/PT 2+ bilat Extremities:  no edema Neurologic:  alert & oriented X3 and cranial nerves II-XII intact.   Skin:  turgor normal.   Psych:  normally interactive.     Impression & Recommendations:  Problem # 1:  PREVENTIVE HEALTH CARE (ICD-V70.0) refuses flu shot give pneumovax  mammo done patient refusing colo at this time get stool cards  PHQ9=7 today denies depression wants to lose weight no suicidal ideations does not want to go to counseling    Orders: KOH/ WET Mount [id number]) EKG w/ Interpretation (93000) T-CBC w/Diff (60454-09811) T-TSH (91478-29562) T-HIV Antibody  (Reflex) (13086-57846) Hemoccult Cards -3 specimans (take home) (96295) Hemoccult Guaiac-1 spec.(in office) (82270) T-Pap Smear, Thin Prep (28413) T-Urinalysis (24401-02725)  Problem # 2:  DIABETES MELLITUS, TYPE II (ICD-250.00) A1C above 7 has seen dietician increase metformin increase activity  Her updated medication list for this problem includes:    Monopril 40 Mg Tabs (Fosinopril sodium) .Marland Kitchen... 1 by mouth qam    Metformin Hcl 1000 Mg Tabs (Metformin hcl) .Marland Kitchen... Take 1 tablet by mouth two times a day    Adult Aspirin Ec Low Strength 81 Mg Tbec (Aspirin) .Marland Kitchen... 1 by mouth qday    Micardis 40 Mg Tabs (Telmisartan) .Marland Kitchen... Take 1 tablet by mouth two times a day  Problem # 3:  HYPERTENSION (ICD-401.9) controlled  Her updated medication list for this problem  includes:    Monopril 40 Mg Tabs (Fosinopril sodium) .Marland Kitchen... 1 by mouth qam    Toprol Xl 50 Mg Tb24 (Metoprolol succinate) .Marland Kitchen... Take 1 tablet by mouth every morning for blood pressure.    Micardis 40 Mg Tabs (Telmisartan) .Marland Kitchen... Take 1 tablet by mouth two times a day    Hydrochlorothiazide 25 Mg Tabs (Hydrochlorothiazide) ..... Half (1/2) a tablet once daily  Orders: T-Comprehensive Metabolic Panel [id number]) T-Urinalysis (25956-38756)  Problem # 4:  HYPERLIPIDEMIA (ICD-272.4) check labs  Her updated medication list for this problem includes:    Crestor 5 Mg Tabs (Rosuvastatin calcium) .Marland Kitchen... 1 by mouth qday  Orders: T-Comprehensive Metabolic Panel [phone]) T-Lipid Profile (16606-30160)  Problem # 5:  VAGINITIS, CANDIDAL (ICD-112.1)  diflucan x 1  Her updated medication list for this problem includes:    Diflucan 150 Mg Tabs (Fluconazole) .Marland Kitchen... Take one by mouth  Complete Medication List: 1)  Monopril 40 Mg Tabs (Fosinopril sodium) .Marland KitchenMarland KitchenMarland Kitchen  1 by mouth qam 2)  Metformin Hcl 1000 Mg Tabs (Metformin hcl) .... Take 1 tablet by mouth two times a day 3)  Crestor 5 Mg Tabs (Rosuvastatin calcium) .Marland Kitchen.. 1 by mouth qday 4)  Adult Aspirin Ec Low Strength 81 Mg Tbec (Aspirin) .Marland Kitchen.. 1 by mouth qday 5)  Toprol Xl 50 Mg Tb24 (Metoprolol succinate) .... Take 1 tablet by mouth every morning for blood pressure. 6)  Zithromax 250 Mg Tabs (Azithromycin) .... Take 2 tablets by mouth today and then 1 pill each day on days 2-5 7)  Tessalon Perles 100 Mg Caps (Benzonatate) .... Take 1 tablet by mouth every 8 hours as needed cough 8)  Proventil Hfa 108 (90 Base) Mcg/act Aers (Albuterol sulfate) .... Use 2 puffs every 4-6 hours for cough 9)  Naprosyn 500 Mg Tabs (Naproxen) .Marland Kitchen.. 1 tablet by mouth every 12 hours as needed for back pain take with food 10)  Micardis 40 Mg Tabs (Telmisartan) .... Take 1 tablet by mouth two times a day 11)  Hydrochlorothiazide 25 Mg Tabs (Hydrochlorothiazide) .... Half (1/2) a  tablet once daily 12)  Diflucan 150 Mg Tabs (Fluconazole) .... Take one by mouth  Other Orders: Pneumococcal Vaccine (13086) Admin 1st Vaccine (57846) Admin 1st Vaccine [facility]) [phone])  Hypertension Assessment/Plan:      The patient's hypertensive risk group is category C: Target organ damage and/or diabetes.  Her calculated 10 year risk of coronary heart disease is 15 %.  Today's blood pressure is 126/80.  Her blood pressure goal is < 130/80.   Patient Instructions: 1)  Pneumovax today. 2)  It is important that you exercise reguarly at least 20 minutes 5 times a week. If you develop chest pain, have severe difficulty breathing, or feel very tired, stop exercising immediately and seek medical attention.  Walking is the best thing you can do. 3)  Try to cut down on your eating.  Try to lose 10 pounds before your next visit. 4)  Please schedule a follow-up appointment in 3 months with Scott for diabetes. 5)  Your metformin dose has been increased to 1000 mg two times a day.  Prescriptions: DIFLUCAN 150 MG TABS (FLUCONAZOLE) Take one by mouth  #1 x 0   Entered and Authorized by:   Tereso Newcomer PA-C   Signed by:   Tereso Newcomer PA-C on 02/28/2010   Method used:   Print then Give to Patient   RxID:   [id number] METFORMIN HCL 1000 MG TABS (METFORMIN HCL) Take 1 tablet by mouth two times a day  #60 x 5   Entered and Authorized by:   Tereso Newcomer PA-C   Signed by:   Tereso Newcomer PA-C on 02/28/2010   Method used:   Print then Give to Patient   RxID:   [id number]   Laboratory Results   Urine Tests  Date/Time Received: February 28, 2010 9:11 AM   Routine Urinalysis   Glucose: negative   (Normal Range: Negative) Bilirubin: negative   (Normal Range: Negative) Ketone: negative   (Normal Range: Negative) Spec. Gravity: >=1.030   (Normal Range: 1.003-1.035) Blood: negative   (Normal Range: Negative) pH: 5.0   (Normal Range: 5.0-8.0) Protein: negative   (Normal Range:  Negative) Urobilinogen: 0.2   (Normal Range: 0-1) Nitrite: negative   (Normal Range: Negative) Leukocyte Esterace: negative   (Normal Range: Negative)     Blood Tests   Date/Time Received: February 28, 2010 9:16 AM   HGBA1C: 7.5%   (Normal Range:  Non-Diabetic - 3-6%   Control Diabetic - 6-8%) CBG Random:: 123mg /dL    Wet Mount Source: vaginal  WBC/hpf: 1-5 Bacteria/hpf: rare Clue cells/hpf: none  Negative whiff Yeast/hpf: few Wet Mount KOH: Negative Trichomonas/hpf: none  Stool - Occult Blood Hemmoccult #1: negative Date: 02/28/2010      Pneumovax Vaccine    Vaccine Type: Pneumovax    Site: left deltoid    Mfr: Merck    Dose: 0.5 ml    Route: IM    Given by: Armenia Shannon    Exp. Date: 01/27/2011    Lot #: [id number]    VIS given: 07/28/96 version given February 28, 2010.

## 2011-01-30 NOTE — Letter (Signed)
Summary: Work Excuse  [facility]  [address] [place], [place] [address]   Phone: [phone]  Fax: [phone]    Today's Date: February 28, 2010  Name of Patient: Jessica Jacobs  The above named patient had a medical visit today at:  9 am / pm.  Please take this into consideration when reviewing the time away from work/school.    Special Instructions:  [ x ] None  [  ] To be off the remainder of today, returning to the normal work / school schedule tomorrow.  [  ] To be off until the next scheduled appointment on ______________________.  [  ] Other ________________________________________________________________ ________________________________________________________________________   Sincerely yours,   Tereso Newcomer PA-C

## 2011-01-30 NOTE — Progress Notes (Signed)
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 Initial call taken by: Leodis Rains,  September 25, 2010 11:46 AM  Follow-up for Phone Call        script is done Follow-up by: Armenia Shannon,  September 25, 2010 11:55 AM

## 2011-01-30 NOTE — Progress Notes (Signed)
Summary: Medication Question   Phone Note Call from Patient Call back at Home Phone [phone] Call back at [phone]   Summary of Call: the pt has a question in reference to naproxyn medication. Please call her back.  Alben Spittle PA-c Initial call taken by: Manon Hilding,  March 24, 2010 12:11 PM  Follow-up for Phone Call        spoke with pt and she said that she wanted a rx for naproxen because it helps her back pain.... 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 and she says when she was taking the naproxen she they took away the pain and she wants to know if she can have a refill called into [facility] pharmacy  Follow-up by: Armenia Shannon,  March 27, 2010 2:49 PM  Additional Follow-up for Phone Call Additional follow up Details #1::        just use as needed not good to take every day Additional Follow-up by: Tereso Newcomer PA-C,  March 27, 2010 5:20 PM    Prescriptions: NAPROSYN 500 MG TABS (NAPROXEN) 1 tablet by mouth every 12 hours as needed for back pain Take with food  #60 x 1   Entered and Authorized by:   Tereso Newcomer PA-C   Signed by:   Tereso Newcomer PA-C on 03/27/2010   Method used:   Faxed to ...       [facility] (retail)       [address] [place], [place]  [address]       Ph: [phone]       Fax: [phone]   RxID:   [id number]

## 2011-01-30 NOTE — Progress Notes (Signed)
   Phone Note Call from Patient Call back at [facility] Phone [phone] Call back at [phone]   Summary of Call: The pt lost her last bottle prescription from [facility] and in our records shows that she still has more refills but when the pt went to the [facility] they told her that she needs more refills.  Please call in the pharmacy. Alben Spittle PA Initial call taken by: Manon Hilding,  January 19, 2010 8:15 AM  Follow-up for Phone Call        spoke with pt and she said she lost her bottle to get her prescription called in by the number.... pt knew she had refills but did not know her refill number... called pharmacy for pt to let them know she wants a refill on the med and she will pick up this afternoon..Armenia Shannon  January 19, 2010 8:24 AM

## 2011-01-30 NOTE — Assessment & Plan Note (Signed)
Summary: FU IN 3 MONTHS WITH SCOTT FOR DM AND BP//GK   Vital Signs:  Patient profile:   65 year old female Height:      65 inches Weight:      205 pounds BMI:     34.24 Temp:     94.3 degrees F oral Pulse rate:   64 / minute Pulse rhythm:   regular Resp:     18 per minute BP sitting:   124 / 84  (right arm) Cuff size:   regular  Vitals Entered By: Armenia Shannon (August 10, 2010 11:18 AM) CC: follow-up visit, DM and BP...., Hypertension Management Is Patient Diabetic? Yes Pain Assessment Patient in pain? no      CBG Result 108  Does patient need assistance? Functional Status Self care Ambulation Normal   Primary Care Schylar Wuebker:  Tereso Newcomer PA-C  CC:  follow-up visit, DM and BP...., and Hypertension Management.  History of Present Illness: Here for f/u.  DM:  A1C under 7.  Does not check sugars.  Takes metformin 1000 two times a day.  Does feel thirsty.  Notes some increased urination.    Hypertension History:      She denies headache, chest pain, dyspnea with exertion, peripheral edema, and syncope.  She notes no problems with any antihypertensive medication side effects.        Positive major cardiovascular risk factors include female age 78 years old or older, diabetes, hyperlipidemia, and hypertension.  Negative major cardiovascular risk factors include negative family history for ischemic heart disease and non-tobacco-user status.     Current Medications (verified): 1)  Monopril 40 Mg  Tabs (Fosinopril Sodium) .Marland Kitchen.. 1 By Mouth Qam 2)  Metformin Hcl 1000 Mg Tabs (Metformin Hcl) .... Take 1 Tablet By Mouth Two Times A Day 3)  Crestor 5 Mg  Tabs (Rosuvastatin Calcium) .Marland Kitchen.. 1 By Mouth Qday 4)  Adult Aspirin Ec Low Strength 81 Mg  Tbec (Aspirin) .Marland Kitchen.. 1 By Mouth Qday 5)  Toprol Xl 50 Mg Tb24 (Metoprolol Succinate) .... Take 1 Tablet By Mouth Every Morning For Blood Pressure. 6)  Zithromax 250 Mg Tabs (Azithromycin) .... Take 2 Tablets By Mouth Today and Then 1 Pill  Each Day On Days 2-5 7)  Tessalon Perles 100 Mg  Caps (Benzonatate) .... Take 1 Tablet By Mouth Every 8 Hours As Needed Cough 8)  Proventil Hfa 108 (90 Base) Mcg/act Aers (Albuterol Sulfate) .... Use 2 Puffs Every 4-6 Hours For Cough 9)  Naprosyn 500 Mg Tabs (Naproxen) .Marland Kitchen.. 1 Tablet By Mouth Every 12 Hours As Needed For Back Pain Take With Food 10)  Micardis 40 Mg Tabs (Telmisartan) .... Take 1 Tablet By Mouth Two Times A Day 11)  Hydrochlorothiazide 25 Mg Tabs (Hydrochlorothiazide) .... Half (1/2) A Tablet Once Daily 12)  Diflucan 150 Mg Tabs (Fluconazole) .... Take One By Mouth  Allergies (verified): No Known Drug Allergies  Physical Exam  General:  alert, well-developed, and well-nourished.   Head:  normocephalic and atraumatic.   Neck:  supple.  no carotid bruits.   Lungs:  normal breath sounds.   Heart:  normal rate and regular rhythm.   Abdomen:  soft, non-tender, and normal bowel sounds.   Extremities:  no edema  Neurologic:  alert & oriented X3 and cranial nerves II-XII intact.   Psych:  normally interactive.    Diabetes Management Exam:    Foot Exam (with socks and/or shoes not present):       Inspection:  Left foot: normal          Right foot: normal       Nails:          Left foot: fungal infection          Right foot: fungal infection   Impression & Recommendations:  Problem # 1:  PREVENTIVE HEALTH CARE (ICD-V70.0)  had colo before sigmoid colectomy in 2003 had diverticulosis never got stool cards . Marland Kitchen Lindsey Demonte states she mailed in  Orders: T-Hemoccult Cards-Multiple [phone])  Problem # 2:  DIABETES MELLITUS, TYPE II (ICD-250.00) controlled  Her updated medication list for this problem includes:    Lisinopril 40 Mg Tabs (Lisinopril) .Marland Kitchen... Take 1 tablet by mouth once a day    Metformin Hcl 1000 Mg Tabs (Metformin hcl) .Marland Kitchen... Take 1 tablet by mouth two times a day    Adult Aspirin Ec Low Strength 81 Mg Tbec (Aspirin) .Marland Kitchen... 1 by mouth qday     Micardis 40 Mg Tabs (Telmisartan) .Marland Kitchen... Take 1 tablet by mouth two times a day  Orders: Capillary Blood Glucose/CBG (82948) Hemoglobin A1C (83036) UA Dipstick w/o Micro (manual) (93235)  Problem # 3:  HYPERTENSION (ICD-401.9) controlled  Her updated medication list for this problem includes:    Lisinopril 40 Mg Tabs (Lisinopril) .Marland Kitchen... Take 1 tablet by mouth once a day    Toprol Xl 50 Mg Tb24 (Metoprolol succinate) .Marland Kitchen... Take 1 tablet by mouth every morning for blood pressure.    Micardis 40 Mg Tabs (Telmisartan) .Marland Kitchen... Take 1 tablet by mouth two times a day    Hydrochlorothiazide 25 Mg Tabs (Hydrochlorothiazide) ..... Half (1/2) a tablet once daily  Orders: UA Dipstick w/o Micro (manual) (57322) T-Comprehensive Metabolic Panel ([address]42-70623)  Problem # 4:  HYPERLIPIDEMIA (ICD-272.4)  Her updated medication list for this problem includes:    Crestor 5 Mg Tabs (Rosuvastatin calcium) .Marland Kitchen... 1 by mouth qday  Orders: T-Comprehensive Metabolic Panel ([address]-15176)  Complete Medication List: 1)  Lisinopril 40 Mg Tabs (Lisinopril) .... Take 1 tablet by mouth once a day 2)  Metformin Hcl 1000 Mg Tabs (Metformin hcl) .... Take 1 tablet by mouth two times a day 3)  Crestor 5 Mg Tabs (Rosuvastatin calcium) .Marland Kitchen.. 1 by mouth qday 4)  Adult Aspirin Ec Low Strength 81 Mg Tbec (Aspirin) .Marland Kitchen.. 1 by mouth qday 5)  Toprol Xl 50 Mg Tb24 (Metoprolol succinate) .... Take 1 tablet by mouth every morning for blood pressure. 6)  Zithromax 250 Mg Tabs (Azithromycin) .... Take 2 tablets by mouth today and then 1 pill each day on days 2-5 7)  Tessalon Perles 100 Mg Caps (Benzonatate) .... Take 1 tablet by mouth every 8 hours as needed cough 8)  Proventil Hfa 108 (90 Base) Mcg/act Aers (Albuterol sulfate) .... Use 2 puffs every 4-6 hours for cough 9)  Naprosyn 500 Mg Tabs (Naproxen) .Marland Kitchen.. 1 tablet by mouth every 12 hours as needed for back pain take with food 10)  Micardis 40 Mg Tabs (Telmisartan) .... Take 1  tablet by mouth two times a day 11)  Hydrochlorothiazide 25 Mg Tabs (Hydrochlorothiazide) .... Half (1/2) a tablet once daily 12)  Diflucan 150 Mg Tabs (Fluconazole) .... Take one by mouth  Hypertension Assessment/Plan:      The patient's hypertensive risk group is category C: Target organ damage and/or diabetes.  Her calculated 10 year risk of coronary heart disease is 17 %.  Today's blood pressure is 124/84.  Her blood pressure goal is < 130/80.  Patient Instructions: 1)  Ask your eye doctor to send me notes when your eyes are checked for diabetes. 2)  Please schedule a follow-up appointment in 5 months with Scott for diabetes and blood pressure. 3)  Complete your hemoccult cards and return them soon.  Unfortunately, we did not receive the ones you did in March. Prescriptions: HYDROCHLOROTHIAZIDE 25 MG TABS (HYDROCHLOROTHIAZIDE) Half (1/2) a tablet once daily  #15 x 11   Entered and Authorized by:   Tereso Newcomer PA-C   Signed by:   Tereso Newcomer PA-C on 08/10/2010   Method used:   Faxed to ...       [facility] (retail)       [address] [place], [place]  [address]       Ph: [phone]       Fax: [phone]   RxID:   [id number] METFORMIN HCL 1000 MG TABS (METFORMIN HCL) Take 1 tablet by mouth two times a day  #60 x 11   Entered and Authorized by:   Tereso Newcomer PA-C   Signed by:   Tereso Newcomer PA-C on 08/10/2010   Method used:   Faxed to ...       [facility] (retail)       [address] [place], [place]  [address]       Ph: [phone]       Fax: [phone]   RxID:   [id number] LISINOPRIL 40 MG TABS (LISINOPRIL) Take 1 tablet by mouth once a day  #30 x 11   Entered and Authorized by:   Tereso Newcomer PA-C   Signed by:   Tereso Newcomer PA-C on 08/10/2010   Method used:   Faxed to ...       [facility] (retail)       [address] [place], [place]  [address]       Ph: [phone]       Fax: [phone]   RxID:   [id number] LISINOPRIL 40 MG TABS (LISINOPRIL) Take 1 tablet by mouth once a day  #30 x 11   Entered and Authorized by:   Tereso Newcomer PA-C   Signed by:   Tereso Newcomer PA-C on 08/10/2010   Method used:   Electronically to        [facility] (retail)       [address]       [place], [place]  [address]       Ph: [phone]       Fax: [phone]   RxID:   [id number] HYDROCHLOROTHIAZIDE 25 MG TABS (HYDROCHLOROTHIAZIDE) Half (1/2) a tablet once daily  #15 x 11   Entered and Authorized by:   Tereso Newcomer PA-C   Signed by:   Tereso Newcomer PA-C on 08/10/2010   Method used:   Electronically to        [facility] (retail)       [address]       [place], [place]  [address]       Ph: [phone]       Fax: [phone]   RxID:   [id number] METFORMIN HCL 1000 MG TABS (METFORMIN HCL) Take 1 tablet by mouth two times  a day  #60 x 11   Entered and Authorized by:   Tereso Newcomer PA-C   Signed by:   Tereso Newcomer PA-C on 08/10/2010   Method used:   Electronically to        [facility] (retail)       [address]       [place], [place]  [address]       Ph: [phone]       Fax: [phone]   RxID:   [id number]   Laboratory Results   Urine Tests    Routine Urinalysis   Color: yellow Glucose: negative   (Normal Range: Negative) Bilirubin: negative   (Normal Range: Negative) Ketone: negative   (Normal Range: Negative) Spec. Gravity: >=1.030   (Normal Range: 1.003-1.035) Blood: negative   (Normal Range: Negative) pH: 5.0   (Normal Range: 5.0-8.0) Protein: negative   (Normal Range: Negative) Urobilinogen: 0.2   (Normal Range: 0-1) Nitrite: negative   (Normal Range: Negative) Leukocyte Esterace: negative   (Normal Range: Negative)     Blood Tests     HGBA1C: 6.9%   (Normal  Range: Non-Diabetic - 3-6%   Control Diabetic - 6-8%) CBG Random:: 108mg /dL     Appended Document: stool for occult    Clinical Lists Changes  Observations: Added new observation of HEMOCCULT: 2/3 POSITIVE for BLOOD (08/22/2010 11:22)      Laboratory Results    Stool - Occult Blood Hemmoccult #1: positive Date: 08/22/2010 Hemoccult #2: positive Date: 08/22/2010 Hemoccult #3: negative Date: 08/22/2010

## 2011-01-30 NOTE — Progress Notes (Signed)
Summary: Office Visit//DEPRESSION SCREENING  Office Visit//DEPRESSION SCREENING   Imported By: Arta Bruce 05/05/2010 12:43:20  _____________________________________________________________________  External Attachment:    Type:   Image     Comment:   External Document

## 2011-01-30 NOTE — Letter (Signed)
Summary: PODIATRY NOTES  PODIATRY NOTES   Imported By: Arta Bruce 05/22/2010 08:43:34  _____________________________________________________________________  External Attachment:    Type:   Image     Comment:   External Document

## 2011-01-30 NOTE — Assessment & Plan Note (Signed)
Summary: OFFICE VISIT/ GK   Vital Signs:  Patient profile:   65 year old female Height:      65 inches Weight:      201 pounds Temp:     97.6 degrees F oral Pulse rate:   70 / minute Pulse rhythm:   regular Resp:     18 per minute BP sitting:   123 / 78  (left arm) Cuff size:   regular  Vitals Entered By: Armenia Shannon (May 04, 2010 3:05 PM) CC: refill on meds.... Is Patient Diabetic? No Pain Assessment Patient in pain? no      CBG Result 108  Does patient need assistance? Functional Status Self care Ambulation Normal   Primary Care Provider:  Tereso Newcomer PA-C  CC:  refill on meds.....  History of Present Illness: Scheduled appt today to find out why she is taking all of her medicines. Needs refills as well.   Not checking sugars at home. No chest pain, sob, syncope.  No headaches.   Current Medications (verified): 1)  Monopril 40 Mg  Tabs (Fosinopril Sodium) .Marland Kitchen.. 1 By Mouth Qam 2)  Metformin Hcl 1000 Mg Tabs (Metformin Hcl) .... Take 1 Tablet By Mouth Two Times A Day 3)  Crestor 5 Mg  Tabs (Rosuvastatin Calcium) .Marland Kitchen.. 1 By Mouth Qday 4)  Adult Aspirin Ec Low Strength 81 Mg  Tbec (Aspirin) .Marland Kitchen.. 1 By Mouth Qday 5)  Toprol Xl 50 Mg Tb24 (Metoprolol Succinate) .... Take 1 Tablet By Mouth Every Morning For Blood Pressure. 6)  Zithromax 250 Mg Tabs (Azithromycin) .... Take 2 Tablets By Mouth Today and Then 1 Pill Each Day On Days 2-5 7)  Tessalon Perles 100 Mg  Caps (Benzonatate) .... Take 1 Tablet By Mouth Every 8 Hours As Needed Cough 8)  Proventil Hfa 108 (90 Base) Mcg/act Aers (Albuterol Sulfate) .... Use 2 Puffs Every 4-6 Hours For Cough 9)  Naprosyn 500 Mg Tabs (Naproxen) .Marland Kitchen.. 1 Tablet By Mouth Every 12 Hours As Needed For Back Pain Take With Food 10)  Micardis 40 Mg Tabs (Telmisartan) .... Take 1 Tablet By Mouth Two Times A Day 11)  Hydrochlorothiazide 25 Mg Tabs (Hydrochlorothiazide) .... Half (1/2) A Tablet Once Daily 12)  Diflucan 150 Mg Tabs (Fluconazole)  .... Take One By Mouth  Allergies (verified): No Known Drug Allergies  Physical Exam  General:  alert, well-developed, and well-nourished.   Head:  normocephalic and atraumatic.   Neck:  supple.   Lungs:  normal breath sounds.   Heart:  normal rate and regular rhythm.   Extremities:  trace left pedal edema and trace right pedal edema.   Neurologic:  alert & oriented X3 and cranial nerves II-XII intact.   Psych:  normally interactive.     Impression & Recommendations:  Problem # 1:  HYPERTENSION (ICD-401.9) controlled explained all of her meds to her  Her updated medication list for this problem includes:    Monopril 40 Mg Tabs (Fosinopril sodium) .Marland Kitchen... 1 by mouth qam    Toprol Xl 50 Mg Tb24 (Metoprolol succinate) .Marland Kitchen... Take 1 tablet by mouth every morning for blood pressure.    Micardis 40 Mg Tabs (Telmisartan) .Marland Kitchen... Take 1 tablet by mouth two times a day    Hydrochlorothiazide 25 Mg Tabs (Hydrochlorothiazide) ..... Half (1/2) a tablet once daily  Problem # 2:  DIABETES MELLITUS, TYPE II (ICD-250.00)  check A1C metformin adjusted last time  Her updated medication list for this problem includes:  Monopril 40 Mg Tabs (Fosinopril sodium) .Marland Kitchen... 1 by mouth qam    Metformin Hcl 1000 Mg Tabs (Metformin hcl) .Marland Kitchen... Take 1 tablet by mouth two times a day    Adult Aspirin Ec Low Strength 81 Mg Tbec (Aspirin) .Marland Kitchen... 1 by mouth qday    Micardis 40 Mg Tabs (Telmisartan) .Marland Kitchen... Take 1 tablet by mouth two times a day  Orders: Capillary Blood Glucose/CBG (82948) Hemoglobin A1C (83036)  Problem # 3:  HYPERLIPIDEMIA (ICD-272.4) optimal at last check  Her updated medication list for this problem includes:    Crestor 5 Mg Tabs (Rosuvastatin calcium) .Marland Kitchen... 1 by mouth qday  Complete Medication List: 1)  Monopril 40 Mg Tabs (Fosinopril sodium) .Marland Kitchen.. 1 by mouth qam 2)  Metformin Hcl 1000 Mg Tabs (Metformin hcl) .... Take 1 tablet by mouth two times a day 3)  Crestor 5 Mg Tabs (Rosuvastatin  calcium) .Marland Kitchen.. 1 by mouth qday 4)  Adult Aspirin Ec Low Strength 81 Mg Tbec (Aspirin) .Marland Kitchen.. 1 by mouth qday 5)  Toprol Xl 50 Mg Tb24 (Metoprolol succinate) .... Take 1 tablet by mouth every morning for blood pressure. 6)  Zithromax 250 Mg Tabs (Azithromycin) .... Take 2 tablets by mouth today and then 1 pill each day on days 2-5 7)  Tessalon Perles 100 Mg Caps (Benzonatate) .... Take 1 tablet by mouth every 8 hours as needed cough 8)  Proventil Hfa 108 (90 Base) Mcg/act Aers (Albuterol sulfate) .... Use 2 puffs every 4-6 hours for cough 9)  Naprosyn 500 Mg Tabs (Naproxen) .Marland Kitchen.. 1 tablet by mouth every 12 hours as needed for back pain take with food 10)  Micardis 40 Mg Tabs (Telmisartan) .... Take 1 tablet by mouth two times a day 11)  Hydrochlorothiazide 25 Mg Tabs (Hydrochlorothiazide) .... Half (1/2) a tablet once daily 12)  Diflucan 150 Mg Tabs (Fluconazole) .... Take one by mouth  Patient Instructions: 1)  Please schedule a follow-up appointment in 3 months with Nikkole Placzek for diabetes and blood pressure.  Appointment in June can be canceled.  Prescriptions: CRESTOR 5 MG  TABS (ROSUVASTATIN CALCIUM) 1 by mouth qday  #30 x 11   Entered and Authorized by:   Tereso Newcomer PA-C   Signed by:   Tereso Newcomer PA-C on 05/04/2010   Method used:   Faxed to ...       [facility] (retail)       [address] [place], [place]  [address]       Ph: [phone]       Fax: [phone]   RxID:   [id number] MICARDIS 40 MG TABS (TELMISARTAN) Take 1 tablet by mouth two times a day  #60 x 11   Entered and Authorized by:   Tereso Newcomer PA-C   Signed by:   Tereso Newcomer PA-C on 05/04/2010   Method used:   Faxed to ...       [facility] (retail)       [address] [place], [place]  [address]       Ph: [phone]       Fax: [phone]   RxID:   [id number]   Laboratory Results   Blood Tests      HGBA1C: 6.5%   (Normal Range: Non-Diabetic - 3-6%   Control Diabetic - 6-8%) CBG Random:: 108mg /dL

## 2011-01-30 NOTE — Progress Notes (Signed)
Summary: REFILLS OIN BP MEDICATION   Phone Note Call from Patient Call back at Home Phone [phone]   Reason for Call: Refill Medication Summary of Call: Jessica Jacobs pt. MS Cahn SAYS THAT  SHE WENT TO [place] [facility] TO GET HER LISINOPRIL 40MG  AND THEY TOLD HER THAT SHE DIDN'T HAVE REFILL AND SHE SAYS THAT HER BOTTLE SAYS THAT SHE HAS REFILLS UOT TO JANUARY 2012. Initial call taken by: Leodis Rains,  April 24, 2010 2:36 PM  Follow-up for Phone Call        spoke with pt and she said she needs refill on lisinopril... did not see this med on file maybe its the monopril but her bottle... spoke with pharmacy and they only have lisinopril and its ready for pt to pick up... pt is aware Follow-up by: Armenia Shannon,  April 24, 2010 3:38 PM

## 2011-02-01 NOTE — Letter (Signed)
Summary: Handout Printed  Printed Handout:  - Laryngitis 

## 2011-02-01 NOTE — Assessment & Plan Note (Signed)
Summary: Acute - Pharyngitis   Vital Signs:  Patient profile:   65 year old female Weight:      203.7 pounds BMI:     34.02 Temp:     97.1 degrees F oral Pulse rate:   68 / minute Pulse rhythm:   regular Resp:     16 per minute BP sitting:   130 / 90  (left arm) Cuff size:   regular  Vitals Entered By: Levon Hedger (December 21, 2010 3:13 PM)  Nutrition Counseling: Patient's BMI is greater than 25 and therefore counseled on weight management options. CC: cough and cold since Monday that is getting worse has tried NyQuil, mucinex,alka-seltzer plus...still feeling bad Is Patient Diabetic? Yes Pain Assessment Patient in pain? no      CBG Result 111 CBG Device ID A  Does patient need assistance? Functional Status Self care Ambulation Normal   Primary Care Provider:  Tereso Newcomer PA-C  CC:  cough and cold since Monday that is getting worse has tried NyQuil, mucinex, and alka-seltzer plus...still feeling bad.  History of Present Illness:  Pt into the office for f/u on cold symptoms +cough started 3 days ago +hoarseness +body aches -fever +slight headache No tobacco abuse - quit 5 years ago Took some nyquil and alka selzer + without resolution of symptoms.    Habits & Providers  Alcohol-Tobacco-Diet     Alcohol drinks/day: 0     Tobacco Status: quit     Passive Smoke Exposure: yes  Exercise-Depression-Behavior     Does Patient Exercise: yes     Type of exercise: walking     Times/week: 7     Drug Use: never     Seat Belt Use: always     Sun Exposure: occasionally  Allergies (verified): No Known Drug Allergies  Review of Systems General:  Denies fever. ENT:  Complains of hoarseness; denies decreased hearing, earache, nasal congestion, and sinus pressure. Resp:  Complains of cough. GI:  Denies abdominal pain, nausea, and vomiting.  Physical Exam  General:  alert.   Head:  normocephalic.   Eyes:  glasses Ears:  bil TM with bony landmarks    Mouth:  pharynx pink and moist.   Lungs:  normal breath sounds.   Heart:  normal rate and regular rhythm.   Abdomen:  normal bowel sounds.   Msk:  up to the exam table Neurologic:  alert & oriented X3.   Skin:  color normal.   Psych:  Oriented X3.     Impression & Recommendations:  Problem # 1:  PHARYNGITIS (ICD-462) handout given advised pt to drink warm tea cough meds as needed  likely viral  note for work given today as pt did not go to work due to illness The following medications were removed from the medication list:    Zithromax 250 Mg Tabs (Azithromycin) .Marland Kitchen... Take 2 tablets by mouth today and then 1 pill each day on days 2-5 Her updated medication list for this problem includes:    Adult Aspirin Ec Low Strength 81 Mg Tbec (Aspirin) .Marland Kitchen... 1 by mouth qday    Naprosyn 500 Mg Tabs (Naproxen) .Marland Kitchen... 1 tablet by mouth every 12 hours as needed for back pain take with food  Complete Medication List: 1)  Lisinopril 40 Mg Tabs (Lisinopril) .... Take 1 tablet by mouth once a day 2)  Metformin Hcl 1000 Mg Tabs (Metformin hcl) .... Take 1 tablet by mouth two times a day 3)  Crestor 5 Mg Tabs (  Rosuvastatin calcium) .Marland Kitchen.. 1 by mouth qday 4)  Adult Aspirin Ec Low Strength 81 Mg Tbec (Aspirin) .Marland Kitchen.. 1 by mouth qday 5)  Toprol Xl 50 Mg Tb24 (Metoprolol succinate) .... Take 1 tablet by mouth every morning for blood pressure. 6)  Proventil Hfa 108 (90 Base) Mcg/act Aers (Albuterol sulfate) .... Use 2 puffs every 4-6 hours for cough 7)  Naprosyn 500 Mg Tabs (Naproxen) .Marland Kitchen.. 1 tablet by mouth every 12 hours as needed for back pain take with food 8)  Micardis 40 Mg Tabs (Telmisartan) .... Take 1 tablet by mouth two times a day 9)  Hydrochlorothiazide 25 Mg Tabs (Hydrochlorothiazide) .... Half (1/2) a tablet once daily 10)  Miralax Powd (Polyethylene glycol 3350) .... As per colonoscopy prep instructions 11)  Promethazine-codeine 6.25-10 Mg/21ml Syrp (Promethazine-codeine) .... 2 teaspoons by mouth  two times a day as needed for cough  Other Orders: Capillary Blood Glucose/CBG (13086)  Patient Instructions: 1)  You should drink warm tea and gargle with warm salt water. 2)  Take cough med as needed - eat bread or crackers before you take this medication.  It may make you sleepy 3)  Symptoms will gradully improve over the next 3-5 days. 4)  Follow up as needed or sooner if hoarseness does not get better in the next 7-10 days Prescriptions: PROMETHAZINE-CODEINE 6.25-10 MG/5ML SYRP (PROMETHAZINE-CODEINE) 2 teaspoons by mouth two times a day as needed for cough  #4 ounces x 0   Entered and Authorized by:   Lehman Prom FNP   Signed by:   Lehman Prom FNP on 12/21/2010   Method used:   Print then Give to Patient   RxID:   [id number]    Orders Added: 1)  Capillary Blood Glucose/CBG [82948] 2)  Est. Patient Level III [[address]010]

## 2011-02-01 NOTE — Letter (Signed)
Summary: Work Excuse  [facility]  [address] [place], [place] [address]   Phone: [phone]  Fax: [phone]    Today's Date: December 21, 2010  Name of Patient: Jessica Jacobs  The above named patient had a medical visit today   Please take this into consideration when reviewing the time away from work  Special Instructions:  [  ] None  [ X ] To be off the remainder of today, returning to the normal work / school schedule tomorrow.  [  ] To be off until the next scheduled appointment on ______________________.  [  ] Other ________________________________________________________________ ________________________________________________________________________   Sincerely yours,   Lehman Prom FNP [facility]

## 2011-02-01 NOTE — Progress Notes (Signed)
Summary: bad cold,sore throat & chills  Phone Note Call from Patient Call back at Home Phone [phone]   Reason for Call: Talk to Nurse Summary of Call: [facility] pt. ms Maheu called this moring to see if she could come in today instead of going to the hospital, she says that she is cold and hot, she is not sure if she is running a feve, cough, sore throat w/a tingle like feeling, congested and stuffy. Initial call taken by: Leodis Rains,  December 21, 2010 8:26 AM  Follow-up for Phone Call        Wanted to be seen -- appt. this afternoon.  Dutch Quint RN  December 21, 2010 12:41 PM  In office.  Dutch Quint RN  December 21, 2010 3:19 PM

## 2011-02-07 NOTE — Letter (Signed)
Summary: PODIATRY  PODIATRY   Imported By: Arta Bruce 02/01/2011 15:43:08  _____________________________________________________________________  External Attachment:    Type:   Image     Comment:   External Document

## 2011-02-22 ENCOUNTER — Encounter: Payer: Self-pay | Admitting: Nurse Practitioner

## 2011-02-22 ENCOUNTER — Encounter (INDEPENDENT_AMBULATORY_CARE_PROVIDER_SITE_OTHER): Payer: Self-pay | Admitting: Nurse Practitioner

## 2011-02-22 DIAGNOSIS — E669 Obesity, unspecified: Secondary | ICD-10-CM

## 2011-02-22 HISTORY — DX: Obesity, unspecified: E66.9

## 2011-02-22 LAB — CONVERTED CEMR LAB
Bilirubin Urine: NEGATIVE
Blood Glucose, Fingerstick: 140
Nitrite: NEGATIVE
Specific Gravity, Urine: >=1.03
Urobilinogen, UA: 0.2
pH: 5

## 2011-02-27 NOTE — Assessment & Plan Note (Signed)
Summary: Diabetes   Vital Signs:  Patient profile:   65 year old female Weight:      206.9 pounds BMI:     34.55 Temp:     97.0 degrees F oral Pulse rate:   70 / minute Pulse rhythm:   regular Resp:     16 per minute BP sitting:   140 / 80  (left arm) Cuff size:   regular  Vitals Entered By: Levon Hedger (February 22, 2011 12:03 PM)  Nutrition Counseling: Patient's BMI is greater than 25 and therefore counseled on weight management options. CC: follow-up visit, Hypertension Management, Lipid Management Is Patient Diabetic? No Pain Assessment Patient in pain? no      CBG Result 140 CBG Device ID B  Does patient need assistance? Functional Status Self care Ambulation Normal  Diabetic Foot Exam Last Podiatry Exam Date: 12/19/2009 Foot Inspection Is there a history of a foot ulcer?              No Is there a foot ulcer now?              No Is there swelling or an abnormal foot shape?          No Are the toenails long?                Yes Are the toenails thick?                Yes Are the toenails ingrown?              No Is there heavy callous build-up?              No Is there pain in the calf muscle (Intermittent claudication) when walking?    NoIs there a claw toe deformity?              No Is there elevated skin temperature?            No Is there limited ankle dorsiflexion?            No Is there foot or ankle muscle weakness?            No  Diabetic Foot Care Education Patient educated on appropriate care of diabetic feet.  Pulse Check          Right Foot          Left Foot Dorsalis Pedis:        normal            normal    10-g (5.07) Semmes-Weinstein Monofilament Test Performed by: Levon Hedger          Right Foot          Left Foot Visual Inspection                 Primary Care Provider:  Tereso Newcomer PA-C  CC:  follow-up visit, Hypertension Management, and Lipid Management.  History of Present Illness:  Pt into the office for diabetes and  htn  Diabetes Management History:      The patient is a 65 years old female who comes in for evaluation of DM Type 2.  She has not been enrolled in the "Diabetic Education Program".  She notes lack of understanding of dietary principles but states that she is following her diet appropriately.  Sensory loss is noted.  Self foot exams are not being performed.  She is not checking home blood sugars.  She says that she  is exercising.  Type of exercise includes: walking.  She is doing this 7 times per week.        Other comments include: Pt does not check her blood sugar at home.  She reports she was checking it at first then she stopped.        There are no symptoms to suggest diabetic complications.  No changes have been made to her treatment plan since last visit.    Hypertension History:      She denies headache, chest pain, and palpitations.  She notes no problems with any antihypertensive medication side effects.        Positive major cardiovascular risk factors include female age 45 years old or older, diabetes, hyperlipidemia, and hypertension.  Negative major cardiovascular risk factors include negative family history for ischemic heart disease and non-tobacco-user status.    Lipid Management History:      Positive NCEP/ATP III risk factors include female age 19 years old or older, diabetes, and hypertension.  Negative NCEP/ATP III risk factors include no family history for ischemic heart disease and non-tobacco-user status.        She expresses no side effects from her lipid-lowering medication.  The patient denies any symptoms to suggest myopathy or liver disease.  Comments: pt is not fasting today for labs.    Habits & Providers  Alcohol-Tobacco-Diet     Alcohol drinks/day: 0     Tobacco Status: quit     Passive Smoke Exposure: yes  Exercise-Depression-Behavior     Does Patient Exercise: yes     Type of exercise: walking     Times/week: 7     Drug Use: never     Seat Belt Use:  always     Sun Exposure: occasionally  Allergies (verified): No Known Drug Allergies  Review of Systems CV:  Denies chest pain or discomfort. Resp:  Complains of shortness of breath; at times when walking up stairs She is employed as a Advertising copywriter and her only exercise is walking around at work but it is not intentional exercise. GI:  Denies abdominal pain, nausea, and vomiting.  Physical Exam  General:  alert.   Head:  normocephalic.   Eyes:  glasses Lungs:  normal breath sounds.   Heart:  normal rate and regular rhythm.   Msk:  up to the exam table Neurologic:  alert & oriented X3.   flat affect  Diabetes Management Exam:    Foot Exam (with socks and/or shoes not present):       Sensory-Monofilament:          Left foot: normal          Right foot: normal   Impression & Recommendations:  Problem # 1:  HYPERTENSION (ICD-401.9) BP slightly elevated today will increase hctz to 25mg  - whole tablet daily (pt aware of the change) Her updated medication list for this problem includes:    Lisinopril 40 Mg Tabs (Lisinopril) .Marland Kitchen... Take 1 tablet by mouth once a day    Toprol Xl 50 Mg Tb24 (Metoprolol succinate) .Marland Kitchen... Take 1 tablet by mouth every morning for blood pressure.    Micardis 40 Mg Tabs (Telmisartan) .Marland Kitchen... Take 1 tablet by mouth two times a day    Hydrochlorothiazide 25 Mg Tabs (Hydrochlorothiazide) ..... One tablet by mouth daily for blood pressure  Problem # 2:  DIABETES MELLITUS, TYPE II (ICD-250.00) doing well Her updated medication list for this problem includes:    Lisinopril 40 Mg Tabs (Lisinopril) .Marland Kitchen... Take  1 tablet by mouth once a day    Metformin Hcl 1000 Mg Tabs (Metformin hcl) .Marland Kitchen... Take 1 tablet by mouth two times a day    Adult Aspirin Ec Low Strength 81 Mg Tbec (Aspirin) .Marland Kitchen... 1 by mouth qday    Micardis 40 Mg Tabs (Telmisartan) .Marland Kitchen... Take 1 tablet by mouth two times a day  Orders: Capillary Blood Glucose/CBG ([address]) UA Dipstick w/o Micro (manual)  (81002) T-Urine Microalbumin w/creat. ratio (82043-[address]) Hemoglobin A1C (83036)  Problem # 3:  HYPERLIPIDEMIA (ICD-272.4) Pt is not fasting today for labs - advsied her that she needs to make a fasting lab appointment particularly because she is taking crestor Her updated medication list for this problem includes:    Crestor 5 Mg Tabs (Rosuvastatin calcium) .Marland Kitchen... 1 by mouth qday  Problem # 4:  OBESITY (ICD-278.00) advised pt that she needs to start some meaningful exercise  Complete Medication List: 1)  Lisinopril 40 Mg Tabs (Lisinopril) .... Take 1 tablet by mouth once a day 2)  Metformin Hcl 1000 Mg Tabs (Metformin hcl) .... Take 1 tablet by mouth two times a day 3)  Crestor 5 Mg Tabs (Rosuvastatin calcium) .Marland Kitchen.. 1 by mouth qday 4)  Adult Aspirin Ec Low Strength 81 Mg Tbec (Aspirin) .Marland Kitchen.. 1 by mouth qday 5)  Toprol Xl 50 Mg Tb24 (Metoprolol succinate) .... Take 1 tablet by mouth every morning for blood pressure. 6)  Proventil Hfa 108 (90 Base) Mcg/act Aers (Albuterol sulfate) .... Use 2 puffs every 4-6 hours for cough 7)  Naprosyn 500 Mg Tabs (Naproxen) .Marland Kitchen.. 1 tablet by mouth every 12 hours as needed for back pain take with food 8)  Micardis 40 Mg Tabs (Telmisartan) .... Take 1 tablet by mouth two times a day 9)  Hydrochlorothiazide 25 Mg Tabs (Hydrochlorothiazide) .... One tablet by mouth daily for blood pressure 10)  Miralax Powd (Polyethylene glycol 3350) .... As per colonoscopy prep instructions  Diabetes Management Assessment/Plan:      The following lipid goals have been established for the patient: Total cholesterol goal of 200; LDL cholesterol goal of 100; HDL cholesterol goal of 40; Triglyceride goal of 150.  Her blood pressure goal is < 130/80.    Hypertension Assessment/Plan:      The patient's hypertensive risk group is category C: Target organ damage and/or diabetes.  Her calculated 10 year risk of coronary heart disease is 24 %.  Today's blood pressure is 140/80.  Her  blood pressure goal is < 130/80.  Lipid Assessment/Plan:      Based on NCEP/ATP III, the patient's risk factor category is "history of diabetes".  The patient's lipid goals are as follows: Total cholesterol goal is 200; LDL cholesterol goal is 100; HDL cholesterol goal is 40; Triglyceride goal is 150.    Patient Instructions: 1)  You need to call to schedule a fasting lab visit once you find out your work schedule for the next 1-2 weeks.  2)  You will need lipids, lft, tsh and blood pressure check. 3)  No food before this visit but you can drink water and take your medications 4)  It is very important that you schedule this appointment to get your cholesterol checked. 5)  Blood pressure - slightly elevated.  Increase HCTZ 25mg  - ONe tablet daily.  Do not cut the pill in half 6)  Diabetes - doing well. Your Hgba1c = 6.2 7)  Keep taking diabetes medications as ordered 8)  Follow up with provider in 4 months  with provider for diabetes and high blood pressure Prescriptions: HYDROCHLOROTHIAZIDE 25 MG TABS (HYDROCHLOROTHIAZIDE) One tablet by mouth daily for blood pressure  #30 x 6   Entered and Authorized by:   Lehman Prom FNP   Signed by:   Lehman Prom FNP on 02/22/2011   Method used:   Faxed to ...       [facility] (retail)       [address] [place], [place]  [address]       Ph: [phone]       Fax: [phone]   RxID:   [id number]    Orders Added: 1)  Capillary Blood Glucose/CBG [82948] 2)  Est. Patient Level III [84132] 3)  UA Dipstick w/o Micro (manual) [81002] 4)  T-Urine Microalbumin w/creat. ratio [82043-[address]] 5)  Hemoglobin A1C [83036]    Laboratory Results   Urine Tests  Date/Time Received: February 22, 2011 12:19 PM   Routine Urinalysis   Glucose: negative   (Normal Range: Negative) Bilirubin: negative   (Normal Range: Negative) Ketone: negative   (Normal Range: Negative) Spec. Gravity: >=1.030    (Normal Range: 1.003-1.035) Blood: negative   (Normal Range: Negative) pH: 5.0   (Normal Range: 5.0-8.0) Protein: negative   (Normal Range: Negative) Urobilinogen: 0.2   (Normal Range: 0-1) Nitrite: negative   (Normal Range: Negative) Leukocyte Esterace: negative   (Normal Range: Negative)     Blood Tests   Date/Time Received: February 22, 2011 12:18 PM   HGBA1C: 6.2%   (Normal Range: Non-Diabetic - 3-6%   Control Diabetic - 6-8%) CBG Random:: 140       Last LDL:                                                 71 (02/28/2010 9:59:00 PM)        Diabetic Foot Exam Last Podiatry Exam Date: 12/19/2009 Foot Inspection Are the toenails thick?                Yes      Diabetic Foot Care Education :Patient educated on appropriate care of diabetic feet.  Pulse Check          Right Foot          Left Foot Dorsalis Pedis:        normal            normal    10-g (5.07) Semmes-Weinstein Monofilament Test Performed by: Levon Hedger          Right Foot          Left Foot Visual Inspection               Test Control      normal         normal Site 1         normal         normal Site 2         normal         normal Site 3         normal         normal Site 4         normal         normal Site 5         normal  normal Site 6         normal         normal Site 7         normal         normal Site 8         normal         normal Site 9         normal         normal Site 10         normal         normal  Impression      normal         normal     Prevention & Chronic Care Immunizations   Influenza vaccine: Pt declined  (11/05/2007)   Influenza vaccine deferral: Refused  (02/22/2011)    Tetanus booster: 03/31/2006: Historical    Pneumococcal vaccine: Pneumovax  (02/28/2010)    H. zoster vaccine: Not documented  Colorectal Screening   Hemoccult: 2/3 POSITIVE for BLOOD  (08/22/2010)    Colonoscopy: Diverticulosis  (10/04/2010)   Colonoscopy due:  10/2015  Other Screening   Pap smear: NEGATIVE FOR INTRAEPITHELIAL LESIONS OR MALIGNANCY.  (02/28/2010)    Mammogram: ASSESSMENT: Negative - BI-RADS 1^MM DIGITAL SCREENING  (01/22/2011)   Mammogram action/deferral: Screening mammogram in 1 year.     (01/19/2009)    DXA bone density scan: Not documented   Smoking status: quit  (02/22/2011)  Diabetes Mellitus   HgbA1C: 6.2  (02/22/2011)    Eye exam: [facility] - next appt Dec.  (07/10/2010)    Foot exam: yes  (02/22/2011)   High risk foot: Not documented   Foot care education: Done  (02/22/2011)    Urine microalbumin/creatinine ratio: Not documented  Lipids   Total Cholesterol: 132  (02/28/2010)   LDL: 71  (02/28/2010)   LDL Direct: Not documented   HDL: 43  (02/28/2010)   Triglycerides: 88  (02/28/2010)    SGOT (AST): 12  (08/10/2010)   SGPT (ALT): 14  (08/10/2010)   Alkaline phosphatase: 96  (08/10/2010)   Total bilirubin: 0.3  (08/10/2010)  Hypertension   Last Blood Pressure: 140 / 80  (02/22/2011)   Serum creatinine: 0.70  (08/10/2010)   Serum potassium 4.8  (08/10/2010)  Self-Management Support :    Diabetes self-management support: Not documented    Hypertension self-management support: Not documented    Lipid self-management support: Not documented

## 2011-03-06 ENCOUNTER — Encounter (INDEPENDENT_AMBULATORY_CARE_PROVIDER_SITE_OTHER): Payer: Self-pay | Admitting: Nurse Practitioner

## 2011-03-06 ENCOUNTER — Encounter: Payer: Self-pay | Admitting: Nurse Practitioner

## 2011-03-06 LAB — CONVERTED CEMR LAB
Albumin: 4.4 g/dL (ref 3.5–5.2)
HDL: 47 mg/dL (ref >39)
LDL Cholesterol: 84 mg/dL (ref 0–99)
TSH: 0.898 microintl units/mL (ref 0.350–4.500)
Total Bilirubin: 0.4 mg/dL (ref 0.3–1.2)
Total CHOL/HDL Ratio: 3.1

## 2011-03-07 ENCOUNTER — Encounter (INDEPENDENT_AMBULATORY_CARE_PROVIDER_SITE_OTHER): Payer: Self-pay | Admitting: Nurse Practitioner

## 2011-03-13 NOTE — Letter (Signed)
Summary: [facility]///CANCELLED  [facility]///CANCELLED   Imported By: Arta Bruce 03/05/2011 14:52:59  _____________________________________________________________________  External Attachment:    Type:   Image     Comment:   External Document

## 2011-03-13 NOTE — Letter (Signed)
Summary: Lipid Letter  [facility]  [address] [place], [place] [address]   Phone: [phone]  Fax: [phone]    03/07/2011  Jessica Jacobs [address] [place], [place]  [address]  Dear [name]:  We have carefully reviewed your last lipid profile from 03/06/2011 and the results are noted below with a summary of recommendations for lipid management.    Cholesterol:       148     Goal: less than 200   HDL "good" Cholesterol:   47     Goal: greater than 40   LDL "bad" Cholesterol:   84     Goal: less than 70   Triglycerides:       83     Goal: less than 150    Labs done during recnet office visit are ok.  See your cholesterol numbers above.  Keep taking your medication as ordered.     Current Medications: 1)    Lisinopril 40 Mg Tabs (Lisinopril) .... Take 1 tablet by mouth once a day 2)    Metformin Hcl 1000 Mg Tabs (Metformin hcl) .... Take 1 tablet by mouth two times a day 3)    Crestor 5 Mg  Tabs (Rosuvastatin calcium) .Marland Kitchen.. 1 by mouth qday 4)    Adult Aspirin Ec Low Strength 81 Mg  Tbec (Aspirin) .Marland Kitchen.. 1 by mouth qday 5)    Toprol Xl 50 Mg Tb24 (Metoprolol succinate) .... Take 1 tablet by mouth every morning for blood pressure. 6)    Proventil Hfa 108 (90 Base) Mcg/act Aers (Albuterol sulfate) .... Use 2 puffs every 4-6 hours for cough 7)    Naprosyn 500 Mg Tabs (Naproxen) .Marland Kitchen.. 1 tablet by mouth every 12 hours as needed for back pain take with food 8)    Micardis 40 Mg Tabs (Telmisartan) .... Take 1 tablet by mouth two times a day 9)    Hydrochlorothiazide 25 Mg Tabs (Hydrochlorothiazide) .... One tablet by mouth daily for blood pressure 10)    Miralax  Powd (Polyethylene glycol 3350) .... As per colonoscopy prep instructions  If you have any questions, please call. We appreciate being able to work with you.   Sincerely,    [facility] Tereso Newcomer PA-C

## 2011-03-13 NOTE — Assessment & Plan Note (Signed)
Summary: BP check  Nurse Visit   Vital Signs:  Patient profile:   65 year old female Pulse rate:   62 / minute Resp:     16 per minute BP sitting:   143 / 89  Vitals Entered By: Sharen Heck RN (March 06, 2011 9:09 AM)  Patient Instructions: 1)  Blood pressure still slightly elevated but will continue current medications and monitor. 2)  Follow up in 4 weeks for high blood pressure.   3)  Be sure to work on diet and exercise to help with medication therapy   Impression & Recommendations:  Problem # 1:  HYPERTENSION (ICD-401.9)  Her updated medication list for this problem includes:    Lisinopril 40 Mg Tabs (Lisinopril) .Marland Kitchen... Take 1 tablet by mouth once a day    Toprol Xl 50 Mg Tb24 (Metoprolol succinate) .Marland Kitchen... Take 1 tablet by mouth every morning for blood pressure.    Micardis 40 Mg Tabs (Telmisartan) .Marland Kitchen... Take 1 tablet by mouth two times a day    Hydrochlorothiazide 25 Mg Tabs (Hydrochlorothiazide) ..... One tablet by mouth daily for blood pressure  Orders: Est. Patient Level I (04540)  Complete Medication List: 1)  Lisinopril 40 Mg Tabs (Lisinopril) .... Take 1 tablet by mouth once a day 2)  Metformin Hcl 1000 Mg Tabs (Metformin hcl) .... Take 1 tablet by mouth two times a day 3)  Crestor 5 Mg Tabs (Rosuvastatin calcium) .Marland Kitchen.. 1 by mouth qday 4)  Adult Aspirin Ec Low Strength 81 Mg Tbec (Aspirin) .Marland Kitchen.. 1 by mouth qday 5)  Toprol Xl 50 Mg Tb24 (Metoprolol succinate) .... Take 1 tablet by mouth every morning for blood pressure. 6)  Proventil Hfa 108 (90 Base) Mcg/act Aers (Albuterol sulfate) .... Use 2 puffs every 4-6 hours for cough 7)  Naprosyn 500 Mg Tabs (Naproxen) .Marland Kitchen.. 1 tablet by mouth every 12 hours as needed for back pain take with food 8)  Micardis 40 Mg Tabs (Telmisartan) .... Take 1 tablet by mouth two times a day 9)  Hydrochlorothiazide 25 Mg Tabs (Hydrochlorothiazide) .... One tablet by mouth daily for blood pressure 10)  Miralax Powd (Polyethylene glycol  3350) .... As per colonoscopy prep instructions  CC: Pt. here for BP check and fasting labs. She reports taking her BP meds this morning.   Allergies: No Known Drug Allergies  Orders Added: 1)  Est. Patient Level I [98119]  Patient given appt. to return in 1 month for another BP check after reviewing info. with Lehman Prom NP. Pt. encouraged to start/continue walking activity

## 2011-04-23 ENCOUNTER — Emergency Department (HOSPITAL_COMMUNITY)
Admission: EM | Admit: 2011-04-23 | Discharge: 2011-04-23 | Disposition: A | Discharge status: Home / Self Care | Payer: Self-pay | Attending: Emergency Medicine | Admitting: Emergency Medicine

## 2011-04-23 ENCOUNTER — Emergency Department (HOSPITAL_COMMUNITY): Payer: Self-pay

## 2011-04-23 DIAGNOSIS — E785 Hyperlipidemia, unspecified: Secondary | ICD-10-CM | POA: Insufficient documentation

## 2011-04-23 DIAGNOSIS — E119 Type 2 diabetes mellitus without complications: Secondary | ICD-10-CM | POA: Insufficient documentation

## 2011-04-23 DIAGNOSIS — Y92009 Unspecified place in unspecified non-institutional (private) residence as the place of occurrence of the external cause: Secondary | ICD-10-CM | POA: Insufficient documentation

## 2011-04-23 DIAGNOSIS — W010XXA Fall on same level from slipping, tripping and stumbling without subsequent striking against object, initial encounter: Secondary | ICD-10-CM | POA: Insufficient documentation

## 2011-04-23 DIAGNOSIS — I10 Essential (primary) hypertension: Secondary | ICD-10-CM | POA: Insufficient documentation

## 2011-04-23 DIAGNOSIS — S90129A Contusion of unspecified lesser toe(s) without damage to nail, initial encounter: Secondary | ICD-10-CM | POA: Insufficient documentation

## 2011-04-23 DIAGNOSIS — M7989 Other specified soft tissue disorders: Secondary | ICD-10-CM | POA: Insufficient documentation

## 2011-04-23 DIAGNOSIS — M79609 Pain in unspecified limb: Secondary | ICD-10-CM | POA: Insufficient documentation

## 2011-05-18 NOTE — Op Note (Signed)
[facility]  Patient:    Jessica Jacobs, Jessica Jacobs Visit Number: [id number] MRN: [id number]          Service Type: SUR Location: 3W [id number] 01 Attending Physician:  Delsa Bern Dictated by:   Lorne Skeens. Hoxworth, M.D. Proc. Date: 04/28/02 Admit Date:  04/28/2002   CC:         Jessica Bonito. [name]., M.D. [facility]   Operative Report  PREOPERATIVE DIAGNOSIS:  Sigmoid colon diverticulitis with stricture.  PREOPERATIVE DIAGNOSIS:  Sigmoid colon diverticulitis with stricture.  SURGICAL PROCEDURES: 1. Laparoscopy. 2. Open sigmoid colectomy.  SURGEON:  Lorne Skeens. Hoxworth, M.D.  ASSISTANT:  Adolph Pollack, M.D.  ANESTHESIA:  General.  BRIEF HISTORY:  [name] is a 65 year old black female, who has had several episodes over the past year of acute sigmoid diverticulitis documented on CT scan which was treated with outpatient antibiotics with resolution.  Due to recurrent episodes, she was recently referred to Dr. Yancey Flemings, and a colonoscopy and barium enema were performed.  Colonoscopy was significant for a stricture at the mid sigmoid colon which would allow passage of the scope. Barium enema was performed which confirmed a very tight 10 cm in length stricture at the mid sigmoid colon.  With this constellation of findings, we recommended sigmoid colectomy.  We plan to approach this laparoscopically and possibly open depending on findings.  The nature of the procedure, indications, risks of bleeding, infection, anastomotic leak were discussed and understood.  She is now brought to the operating room for this procedure.  DESCRIPTION OF OPERATION:  Following mechanical antibiotic bowel prep at home, the patient brought to the operating room and placed in the supine position on the operating table, and general endotracheal anesthesia was induced.  She was carefully positioned in lithotomy position.  A Foley catheter and oral gastric tube were  placed.  She received preoperative IV antibiotics.  The abdomen and perineum were sterilely prepped and draped.  Initially, a 1 cm incision was made at the umbilicus and the fascia exposed, incised for 1 cm.  The peritoneum entered under direct vision, the Hasson trocar placed, and pneumoperitoneum established.  Under direct vision, a 12 mm trocar was placed in the right lower quadrant.  A 7 cm oblique incision was made in the left lower quadrant for placement of the Lap Disc device.  A muscle-splitting incision was used and the Lap Disc placed.  Left hand was inserted through the Disc and pneumoperitoneum reestablished.  Exploration revealed an area of marked chronic inflammatory change of the mid to distal sigmoid colon with dense chronic inflammatory adhesions to the left pelvic sidewall.  There were omental adhesions to the anterior abdominal wall from previous hysterectomy. They were taken down with the harmonic scalpel.  Some small bowel adhesions in the pelvis were sharply lysed and the small bowel mobilized up out of the pelvis.  We then proceeded with mobilization laparoscopically of the left colon proximal to the area of intensive inflammatory change dividing the peritoneal attachments laterally.  Distally, some soft rectosigmoid could be felt beyond the inflammatory change.  Some of the inflammatory adhesions were initially freed from the lateral pelvic sidewall, but there was dense, severe fibrotic reaction here, and I felt it really was not safe to proceed laparoscopically.  The left ureter had been identified but distally appeared very close to this process.  At this point, the Lap Disc and the trocars were removed, and the incision was extended somewhat in a hockey-stick  fashion across the midline suprapubically through her old Pfannenstiel incision and obliquely in the left lower quadrant superiorly.  This allowed good exposure of the pelvis and rectosigmoid.  Following this,  the area of dense, inflammatory change which was to the bladder and pelvic sidewall was carefully taken down with blunt and sharp and harmonic scalpel dissection.  There was no identifiable plane with marked inflammatory change and actually had about a 1 cm abscess next to the bladder, another 1 cm abscess against the pelvic sidewall, were opened and drained with thick pus, and this was cultured and suctioned and irrigated.  The dissection was rather tedious, but the inflammatory mass was able to be freed finally from the bladder and pelvic sidewall.  The ureter had been traced and avoided in this area, but there was dense fibrotic reaction around the ureter distally, and it really could not be identified as it approached very close to the inflammatory process around the colon.  I was careful to stay near the bowel wall but could not really reliably identify the ureter in the inflammatory process after the bowel was mobilized.  I therefore asked Dr. Earlene Plater to plan a retrograde cystoscopy and left retrograde pyelogram at the end of the case to confirm that the ureter was intact and not obstructed.  After full mobilization of the colon, points of the proximal distal resection were chosen at the rectosigmoid at about the sacral promontory and the distal left colon.  With mobilization of the left colon, this allowed the veru to be brought down under no significant tension. The points of division of the bowel were identified and proximally the bowel was divided between Kocher and Allen clamp after clearing the bowel or pericolic fat and mesentery.  Mesentery and the involved segment were then sequentially tied between clamps and tied with 2-0 silk ties, doubly tying the large inferior mesenteric branch.  As the dissection progressed distally, the rectosigmoid was cleaned of mesentery and pericolic fat and bowel divided after placement of two stay sutures.  Following this, an  end-to-end anastomosis was created with interrupted full-thickness 2-0 silk sutures.  The  anastomosis appeared widely patent, under no tension, with excellent blood supply.  The head and pelvis was irrigated, hemostasis assured.  Dr. Earlene Plater then proceeded with the retrograde pyelogram that showed no abnormalities of the ureter.  The viscera were returned to their anatomic position.  The wound was closed in two layers with running 0 PDS.  The wound was irrigated with antibiotic solution.  The skin was closed with staples.  Sponge, needle, and instrument counts were correct.  Dry sterile dressings were applied and the patient taken to recovery in good condition. Dictated by:   Lorne Skeens. Hoxworth, M.D. Attending Physician:  Delsa Bern DD:  04/28/02 TD:  04/28/02 Job: [id number]

## 2011-05-18 NOTE — Discharge Summary (Signed)
[facility]  Patient:    Jessica Jacobs, Jessica Jacobs Visit Number: [id number] MRN: [id number]          Service Type: SUR Location: 3W [id number] 01 Attending Physician:  Delsa Bern Dictated by:   Lorne Skeens. Hoxworth, M.D. Admit Date:  04/28/2002 Discharge Date: 05/03/2002                             Discharge Summary  DISCHARGE DIAGNOSIS:  Acute and chronic diverticulitis with stricture.  OPERATIONS AND PROCEDURES:  Laparoscopy and open sigmoid colectomy on April 28, 2002.  HISTORY OF PRESENT ILLNESS:  The patient is a 65 year old black female followed at [facility].  Over the past year, has been treated for three episodes of severe acute onset lower abdominal pain.  These have not required hospitalization, but has been diagnosed as diverticulitis by CT, and treated as an outpatient.  She remains relatively asymptomatic between episodes. Due to recurrence of the episode, she was recently evaluated by Dr. Yancey Flemings, and a colonoscopy and barium were performed.  These both revealed marked narrowing in the sigmoid colon with a significant area of stricture about 10 cm long.  The colonoscope could not be passed.  Due to repeated episodes of diverticulitis, now resulting in a tight stricture of the sigmoid colon, she was admitted electively for sigmoid colectomy.  PAST MEDICAL HISTORY:  She was treated for hypertension.  PAST SURGICAL HISTORY:  Includes hysterectomy through a Pfannenstiel incision and lumbar laminectomy.  MEDICATIONS:  Lotensin daily.  ALLERGIES:  No known drug allergies.  SOCIAL HISTORY:  See detailed H&P.  FAMILY HISTORY:  See detailed H&P.  REVIEW OF SYSTEMS:  See detailed H&P.  PERTINENT PHYSICAL EXAMINATION:  She is afebrile and vital signs are all within normal limits.  The only significant finding is mild lower abdominal tenderness.  HOSPITAL COURSE:  The patient was admitted on the morning of the  procedure following a mechanical and antibiotic bowel prep at home.  At the time of the laparoscopy, she was found to have a markedly inflamed area of sigmoid colon with dense adherence to the pelvic side wall.  This was converted to an open procedure, and she underwent a somewhat difficult sigmoid colectomy due to the intense inflammatory reaction.  The left ureter was identified, but was densely involved in this process, and following completion of a colectomy, a left retrograde pyelogram was performed by Dr. Earlene Plater with the ureter normal and intact.  The postoperative course was fairly routine.  Blood pressure was managed initially with IV labetalol, and then she was switched to oral medications. She was started on a clear liquid diet on the second postoperative day.  She did receive a therapeutic course of cefotetan due to a couple of small pericolonic abscesses that were drained during the procedure.  She had low grade fever throughout the 48 hours that resolved.  She progressed very nicely and was felt ready for discharge on May 03, 2002.  The incision is healing nicely and staples remain intact.  She is tolerating a regular diet. Discharge medications are Vicodin as needed for pain.  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  Followup in my office in one week. Dictated by:   Lorne Skeens. Hoxworth, M.D. Attending Physician:  Delsa Bern DD:  05/22/02 TD:  05/26/02 Job: [id number]

## 2011-06-28 ENCOUNTER — Emergency Department (HOSPITAL_COMMUNITY)
Admission: EM | Admit: 2011-06-28 | Discharge: 2011-06-28 | Disposition: A | Discharge status: Home / Self Care | Payer: Self-pay | Attending: Emergency Medicine | Admitting: Emergency Medicine

## 2011-06-28 DIAGNOSIS — E119 Type 2 diabetes mellitus without complications: Secondary | ICD-10-CM | POA: Insufficient documentation

## 2011-06-28 DIAGNOSIS — E785 Hyperlipidemia, unspecified: Secondary | ICD-10-CM | POA: Insufficient documentation

## 2011-06-28 DIAGNOSIS — H60399 Other infective otitis externa, unspecified ear: Secondary | ICD-10-CM | POA: Insufficient documentation

## 2011-06-28 DIAGNOSIS — I10 Essential (primary) hypertension: Secondary | ICD-10-CM | POA: Insufficient documentation

## 2011-07-30 ENCOUNTER — Emergency Department (HOSPITAL_COMMUNITY)
Admission: EM | Admit: 2011-07-30 | Discharge: 2011-07-30 | Disposition: A | Discharge status: Home / Self Care | Payer: Self-pay | Attending: Emergency Medicine | Admitting: Emergency Medicine

## 2011-07-30 ENCOUNTER — Emergency Department (HOSPITAL_COMMUNITY): Payer: Self-pay

## 2011-07-30 DIAGNOSIS — R0602 Shortness of breath: Secondary | ICD-10-CM | POA: Insufficient documentation

## 2011-07-30 DIAGNOSIS — R059 Cough, unspecified: Secondary | ICD-10-CM | POA: Insufficient documentation

## 2011-07-30 DIAGNOSIS — R062 Wheezing: Secondary | ICD-10-CM | POA: Insufficient documentation

## 2011-07-30 DIAGNOSIS — E785 Hyperlipidemia, unspecified: Secondary | ICD-10-CM | POA: Insufficient documentation

## 2011-07-30 DIAGNOSIS — Z79899 Other long term (current) drug therapy: Secondary | ICD-10-CM | POA: Insufficient documentation

## 2011-07-30 DIAGNOSIS — R05 Cough: Secondary | ICD-10-CM | POA: Insufficient documentation

## 2011-07-30 DIAGNOSIS — I10 Essential (primary) hypertension: Secondary | ICD-10-CM | POA: Insufficient documentation

## 2011-07-30 DIAGNOSIS — R0609 Other forms of dyspnea: Secondary | ICD-10-CM | POA: Insufficient documentation

## 2011-07-30 DIAGNOSIS — R110 Nausea: Secondary | ICD-10-CM | POA: Insufficient documentation

## 2011-07-30 DIAGNOSIS — R0989 Other specified symptoms and signs involving the circulatory and respiratory systems: Secondary | ICD-10-CM | POA: Insufficient documentation

## 2011-07-30 DIAGNOSIS — J189 Pneumonia, unspecified organism: Secondary | ICD-10-CM | POA: Insufficient documentation

## 2011-07-30 DIAGNOSIS — E119 Type 2 diabetes mellitus without complications: Secondary | ICD-10-CM | POA: Insufficient documentation

## 2011-08-16 ENCOUNTER — Other Ambulatory Visit: Payer: Self-pay | Admitting: Physician Assistant

## 2011-09-25 ENCOUNTER — Other Ambulatory Visit: Payer: Self-pay | Admitting: Physician Assistant

## 2012-05-05 ENCOUNTER — Other Ambulatory Visit: Payer: Self-pay | Admitting: Internal Medicine

## 2012-05-05 DIAGNOSIS — Z780 Asymptomatic menopausal state: Secondary | ICD-10-CM

## 2012-05-05 DIAGNOSIS — Z1231 Encounter for screening mammogram for malignant neoplasm of breast: Secondary | ICD-10-CM

## 2012-05-23 ENCOUNTER — Ambulatory Visit
Admission: RE | Admit: 2012-05-23 | Discharge: 2012-05-23 | Disposition: A | Discharge status: Home / Self Care | Payer: Medicare Other | Source: Ambulatory Visit | Attending: Internal Medicine | Admitting: Internal Medicine

## 2012-05-23 DIAGNOSIS — Z1231 Encounter for screening mammogram for malignant neoplasm of breast: Secondary | ICD-10-CM

## 2012-05-23 DIAGNOSIS — Z780 Asymptomatic menopausal state: Secondary | ICD-10-CM

## 2013-04-27 ENCOUNTER — Other Ambulatory Visit: Payer: Self-pay | Admitting: Internal Medicine

## 2013-04-27 ENCOUNTER — Other Ambulatory Visit: Payer: Self-pay

## 2013-04-27 DIAGNOSIS — Z1231 Encounter for screening mammogram for malignant neoplasm of breast: Secondary | ICD-10-CM

## 2013-05-26 ENCOUNTER — Ambulatory Visit: Payer: Medicare Other

## 2013-05-26 ENCOUNTER — Ambulatory Visit
Admission: RE | Admit: 2013-05-26 | Discharge: 2013-05-26 | Disposition: A | Discharge status: Home / Self Care | Payer: Medicare Other | Source: Ambulatory Visit

## 2013-05-26 DIAGNOSIS — Z1231 Encounter for screening mammogram for malignant neoplasm of breast: Secondary | ICD-10-CM

## 2014-04-21 ENCOUNTER — Other Ambulatory Visit: Payer: Self-pay

## 2014-04-21 DIAGNOSIS — Z1231 Encounter for screening mammogram for malignant neoplasm of breast: Secondary | ICD-10-CM

## 2014-05-27 ENCOUNTER — Ambulatory Visit: Payer: Medicare Other

## 2014-07-13 ENCOUNTER — Ambulatory Visit: Payer: Medicare Other

## 2014-07-15 ENCOUNTER — Encounter (INDEPENDENT_AMBULATORY_CARE_PROVIDER_SITE_OTHER): Payer: Self-pay

## 2014-07-15 ENCOUNTER — Ambulatory Visit
Admission: RE | Admit: 2014-07-15 | Discharge: 2014-07-15 | Disposition: A | Discharge status: Home / Self Care | Payer: Medicare Other | Source: Ambulatory Visit

## 2014-07-15 DIAGNOSIS — Z1231 Encounter for screening mammogram for malignant neoplasm of breast: Secondary | ICD-10-CM

## 2015-01-13 DIAGNOSIS — E139 Other specified diabetes mellitus without complications: Secondary | ICD-10-CM | POA: Diagnosis not present

## 2015-01-13 DIAGNOSIS — L0231 Cutaneous abscess of buttock: Secondary | ICD-10-CM | POA: Diagnosis not present

## 2015-01-13 DIAGNOSIS — I10 Essential (primary) hypertension: Secondary | ICD-10-CM | POA: Diagnosis not present

## 2015-01-13 DIAGNOSIS — E780 Pure hypercholesterolemia: Secondary | ICD-10-CM | POA: Diagnosis not present

## 2015-01-19 DIAGNOSIS — L0231 Cutaneous abscess of buttock: Secondary | ICD-10-CM | POA: Diagnosis not present

## 2015-02-25 DIAGNOSIS — L0231 Cutaneous abscess of buttock: Secondary | ICD-10-CM | POA: Diagnosis not present

## 2015-02-28 DIAGNOSIS — I739 Peripheral vascular disease, unspecified: Secondary | ICD-10-CM | POA: Diagnosis not present

## 2015-02-28 DIAGNOSIS — M792 Neuralgia and neuritis, unspecified: Secondary | ICD-10-CM | POA: Diagnosis not present

## 2015-02-28 DIAGNOSIS — E1151 Type 2 diabetes mellitus with diabetic peripheral angiopathy without gangrene: Secondary | ICD-10-CM | POA: Diagnosis not present

## 2015-02-28 DIAGNOSIS — L603 Nail dystrophy: Secondary | ICD-10-CM | POA: Diagnosis not present

## 2015-02-28 DIAGNOSIS — G609 Hereditary and idiopathic neuropathy, unspecified: Secondary | ICD-10-CM | POA: Diagnosis not present

## 2015-03-07 DIAGNOSIS — M792 Neuralgia and neuritis, unspecified: Secondary | ICD-10-CM | POA: Diagnosis not present

## 2015-03-15 DIAGNOSIS — L0231 Cutaneous abscess of buttock: Secondary | ICD-10-CM | POA: Diagnosis not present

## 2015-03-21 DIAGNOSIS — M792 Neuralgia and neuritis, unspecified: Secondary | ICD-10-CM | POA: Diagnosis not present

## 2015-04-13 DIAGNOSIS — E0840 Diabetes mellitus due to underlying condition with diabetic neuropathy, unspecified: Secondary | ICD-10-CM | POA: Diagnosis not present

## 2015-04-13 DIAGNOSIS — I10 Essential (primary) hypertension: Secondary | ICD-10-CM | POA: Diagnosis not present

## 2015-04-13 DIAGNOSIS — E119 Type 2 diabetes mellitus without complications: Secondary | ICD-10-CM | POA: Diagnosis not present

## 2015-04-13 DIAGNOSIS — Z6835 Body mass index (BMI) 35.0-35.9, adult: Secondary | ICD-10-CM | POA: Diagnosis not present

## 2015-04-13 DIAGNOSIS — E780 Pure hypercholesterolemia: Secondary | ICD-10-CM | POA: Diagnosis not present

## 2015-04-13 DIAGNOSIS — G629 Polyneuropathy, unspecified: Secondary | ICD-10-CM | POA: Diagnosis not present

## 2015-04-13 DIAGNOSIS — E663 Overweight: Secondary | ICD-10-CM | POA: Diagnosis not present

## 2015-04-14 DIAGNOSIS — R202 Paresthesia of skin: Secondary | ICD-10-CM | POA: Diagnosis not present

## 2015-04-14 DIAGNOSIS — G603 Idiopathic progressive neuropathy: Secondary | ICD-10-CM | POA: Diagnosis not present

## 2015-04-14 DIAGNOSIS — E559 Vitamin D deficiency, unspecified: Secondary | ICD-10-CM | POA: Diagnosis not present

## 2015-04-14 DIAGNOSIS — G609 Hereditary and idiopathic neuropathy, unspecified: Secondary | ICD-10-CM | POA: Diagnosis not present

## 2015-04-14 DIAGNOSIS — M5417 Radiculopathy, lumbosacral region: Secondary | ICD-10-CM | POA: Diagnosis not present

## 2015-04-14 DIAGNOSIS — M5412 Radiculopathy, cervical region: Secondary | ICD-10-CM | POA: Diagnosis not present

## 2015-04-14 DIAGNOSIS — R2689 Other abnormalities of gait and mobility: Secondary | ICD-10-CM | POA: Diagnosis not present

## 2015-04-14 DIAGNOSIS — G5602 Carpal tunnel syndrome, left upper limb: Secondary | ICD-10-CM | POA: Diagnosis not present

## 2015-04-14 DIAGNOSIS — R634 Abnormal weight loss: Secondary | ICD-10-CM | POA: Diagnosis not present

## 2015-05-09 DIAGNOSIS — L603 Nail dystrophy: Secondary | ICD-10-CM | POA: Diagnosis not present

## 2015-05-09 DIAGNOSIS — E1151 Type 2 diabetes mellitus with diabetic peripheral angiopathy without gangrene: Secondary | ICD-10-CM | POA: Diagnosis not present

## 2015-05-09 DIAGNOSIS — I739 Peripheral vascular disease, unspecified: Secondary | ICD-10-CM | POA: Diagnosis not present

## 2015-05-26 DIAGNOSIS — M5417 Radiculopathy, lumbosacral region: Secondary | ICD-10-CM | POA: Diagnosis not present

## 2015-05-26 DIAGNOSIS — M5412 Radiculopathy, cervical region: Secondary | ICD-10-CM | POA: Diagnosis not present

## 2015-05-26 DIAGNOSIS — G5602 Carpal tunnel syndrome, left upper limb: Secondary | ICD-10-CM | POA: Diagnosis not present

## 2015-05-26 DIAGNOSIS — G603 Idiopathic progressive neuropathy: Secondary | ICD-10-CM | POA: Diagnosis not present

## 2015-06-10 ENCOUNTER — Other Ambulatory Visit: Payer: Self-pay

## 2015-06-10 DIAGNOSIS — Z1231 Encounter for screening mammogram for malignant neoplasm of breast: Secondary | ICD-10-CM

## 2015-06-20 DIAGNOSIS — H4011X1 Primary open-angle glaucoma, mild stage: Secondary | ICD-10-CM | POA: Diagnosis not present

## 2015-07-19 ENCOUNTER — Ambulatory Visit
Admission: RE | Admit: 2015-07-19 | Discharge: 2015-07-19 | Disposition: A | Discharge status: Home / Self Care | Payer: Commercial Managed Care - HMO | Source: Ambulatory Visit

## 2015-07-19 DIAGNOSIS — Z1231 Encounter for screening mammogram for malignant neoplasm of breast: Secondary | ICD-10-CM | POA: Diagnosis not present

## 2015-08-01 DIAGNOSIS — E1151 Type 2 diabetes mellitus with diabetic peripheral angiopathy without gangrene: Secondary | ICD-10-CM | POA: Diagnosis not present

## 2015-08-01 DIAGNOSIS — I739 Peripheral vascular disease, unspecified: Secondary | ICD-10-CM | POA: Diagnosis not present

## 2015-08-01 DIAGNOSIS — L603 Nail dystrophy: Secondary | ICD-10-CM | POA: Diagnosis not present

## 2015-08-10 DIAGNOSIS — R7309 Other abnormal glucose: Secondary | ICD-10-CM | POA: Diagnosis not present

## 2015-08-10 DIAGNOSIS — E663 Overweight: Secondary | ICD-10-CM | POA: Diagnosis not present

## 2015-08-10 DIAGNOSIS — Z6835 Body mass index (BMI) 35.0-35.9, adult: Secondary | ICD-10-CM | POA: Diagnosis not present

## 2015-08-10 DIAGNOSIS — E0840 Diabetes mellitus due to underlying condition with diabetic neuropathy, unspecified: Secondary | ICD-10-CM | POA: Diagnosis not present

## 2015-08-10 DIAGNOSIS — G629 Polyneuropathy, unspecified: Secondary | ICD-10-CM | POA: Diagnosis not present

## 2015-08-10 DIAGNOSIS — E780 Pure hypercholesterolemia: Secondary | ICD-10-CM | POA: Diagnosis not present

## 2015-08-10 DIAGNOSIS — I10 Essential (primary) hypertension: Secondary | ICD-10-CM | POA: Diagnosis not present

## 2015-08-18 DIAGNOSIS — M5417 Radiculopathy, lumbosacral region: Secondary | ICD-10-CM | POA: Diagnosis not present

## 2015-08-18 DIAGNOSIS — M5442 Lumbago with sciatica, left side: Secondary | ICD-10-CM | POA: Diagnosis not present

## 2015-08-18 DIAGNOSIS — G5602 Carpal tunnel syndrome, left upper limb: Secondary | ICD-10-CM | POA: Diagnosis not present

## 2015-08-18 DIAGNOSIS — R202 Paresthesia of skin: Secondary | ICD-10-CM | POA: Diagnosis not present

## 2015-08-18 DIAGNOSIS — G603 Idiopathic progressive neuropathy: Secondary | ICD-10-CM | POA: Diagnosis not present

## 2015-08-18 DIAGNOSIS — M5441 Lumbago with sciatica, right side: Secondary | ICD-10-CM | POA: Diagnosis not present

## 2015-10-04 DIAGNOSIS — R197 Diarrhea, unspecified: Secondary | ICD-10-CM | POA: Diagnosis not present

## 2015-10-05 DIAGNOSIS — R197 Diarrhea, unspecified: Secondary | ICD-10-CM | POA: Diagnosis not present

## 2015-10-24 DIAGNOSIS — L603 Nail dystrophy: Secondary | ICD-10-CM | POA: Diagnosis not present

## 2015-10-24 DIAGNOSIS — E1151 Type 2 diabetes mellitus with diabetic peripheral angiopathy without gangrene: Secondary | ICD-10-CM | POA: Diagnosis not present

## 2015-10-24 DIAGNOSIS — I739 Peripheral vascular disease, unspecified: Secondary | ICD-10-CM | POA: Diagnosis not present

## 2015-11-21 DIAGNOSIS — Z6833 Body mass index (BMI) 33.0-33.9, adult: Secondary | ICD-10-CM | POA: Diagnosis not present

## 2015-11-21 DIAGNOSIS — H4010X0 Unspecified open-angle glaucoma, stage unspecified: Secondary | ICD-10-CM | POA: Diagnosis not present

## 2015-11-21 DIAGNOSIS — I10 Essential (primary) hypertension: Secondary | ICD-10-CM | POA: Diagnosis not present

## 2015-11-21 DIAGNOSIS — R9431 Abnormal electrocardiogram [ECG] [EKG]: Secondary | ICD-10-CM | POA: Diagnosis not present

## 2015-11-21 DIAGNOSIS — E1169 Type 2 diabetes mellitus with other specified complication: Secondary | ICD-10-CM | POA: Diagnosis not present

## 2015-11-21 DIAGNOSIS — Z0000 Encounter for general adult medical examination without abnormal findings: Secondary | ICD-10-CM | POA: Diagnosis not present

## 2015-11-21 DIAGNOSIS — E782 Mixed hyperlipidemia: Secondary | ICD-10-CM | POA: Diagnosis not present

## 2015-11-21 DIAGNOSIS — I700 Atherosclerosis of aorta: Secondary | ICD-10-CM | POA: Diagnosis not present

## 2015-12-27 DIAGNOSIS — H524 Presbyopia: Secondary | ICD-10-CM | POA: Diagnosis not present

## 2015-12-27 DIAGNOSIS — H25093 Other age-related incipient cataract, bilateral: Secondary | ICD-10-CM | POA: Diagnosis not present

## 2015-12-27 DIAGNOSIS — H11153 Pinguecula, bilateral: Secondary | ICD-10-CM | POA: Diagnosis not present

## 2015-12-27 DIAGNOSIS — E119 Type 2 diabetes mellitus without complications: Secondary | ICD-10-CM | POA: Diagnosis not present

## 2015-12-27 DIAGNOSIS — H18413 Arcus senilis, bilateral: Secondary | ICD-10-CM | POA: Diagnosis not present

## 2015-12-27 DIAGNOSIS — H401132 Primary open-angle glaucoma, bilateral, moderate stage: Secondary | ICD-10-CM | POA: Diagnosis not present

## 2015-12-27 DIAGNOSIS — I10 Essential (primary) hypertension: Secondary | ICD-10-CM | POA: Diagnosis not present

## 2015-12-27 DIAGNOSIS — H5210 Myopia, unspecified eye: Secondary | ICD-10-CM | POA: Diagnosis not present

## 2015-12-27 DIAGNOSIS — H5203 Hypermetropia, bilateral: Secondary | ICD-10-CM | POA: Diagnosis not present

## 2015-12-27 DIAGNOSIS — H52223 Regular astigmatism, bilateral: Secondary | ICD-10-CM | POA: Diagnosis not present

## 2016-01-23 DIAGNOSIS — G629 Polyneuropathy, unspecified: Secondary | ICD-10-CM | POA: Diagnosis not present

## 2016-01-23 DIAGNOSIS — Z6836 Body mass index (BMI) 36.0-36.9, adult: Secondary | ICD-10-CM | POA: Diagnosis not present

## 2016-01-23 DIAGNOSIS — E663 Overweight: Secondary | ICD-10-CM | POA: Diagnosis not present

## 2016-01-23 DIAGNOSIS — E0840 Diabetes mellitus due to underlying condition with diabetic neuropathy, unspecified: Secondary | ICD-10-CM | POA: Diagnosis not present

## 2016-01-23 DIAGNOSIS — I10 Essential (primary) hypertension: Secondary | ICD-10-CM | POA: Diagnosis not present

## 2016-01-23 DIAGNOSIS — Z0000 Encounter for general adult medical examination without abnormal findings: Secondary | ICD-10-CM | POA: Diagnosis not present

## 2016-01-23 DIAGNOSIS — Z1389 Encounter for screening for other disorder: Secondary | ICD-10-CM | POA: Diagnosis not present

## 2016-01-23 DIAGNOSIS — E7800 Pure hypercholesterolemia, unspecified: Secondary | ICD-10-CM | POA: Diagnosis not present

## 2016-01-23 DIAGNOSIS — Z7984 Long term (current) use of oral hypoglycemic drugs: Secondary | ICD-10-CM | POA: Diagnosis not present

## 2016-03-14 DIAGNOSIS — L603 Nail dystrophy: Secondary | ICD-10-CM | POA: Diagnosis not present

## 2016-03-14 DIAGNOSIS — I739 Peripheral vascular disease, unspecified: Secondary | ICD-10-CM | POA: Diagnosis not present

## 2016-03-14 DIAGNOSIS — E1151 Type 2 diabetes mellitus with diabetic peripheral angiopathy without gangrene: Secondary | ICD-10-CM | POA: Diagnosis not present

## 2016-05-30 DIAGNOSIS — E1141 Type 2 diabetes mellitus with diabetic mononeuropathy: Secondary | ICD-10-CM | POA: Diagnosis not present

## 2016-06-26 DIAGNOSIS — H401132 Primary open-angle glaucoma, bilateral, moderate stage: Secondary | ICD-10-CM | POA: Diagnosis not present

## 2016-06-26 DIAGNOSIS — Z0100 Encounter for examination of eyes and vision without abnormal findings: Secondary | ICD-10-CM | POA: Diagnosis not present

## 2016-07-02 ENCOUNTER — Other Ambulatory Visit: Payer: Self-pay | Admitting: Internal Medicine

## 2016-07-02 DIAGNOSIS — Z1231 Encounter for screening mammogram for malignant neoplasm of breast: Secondary | ICD-10-CM

## 2016-07-19 ENCOUNTER — Ambulatory Visit
Admission: RE | Admit: 2016-07-19 | Discharge: 2016-07-19 | Disposition: A | Discharge status: Home / Self Care | Payer: Commercial Managed Care - HMO | Source: Ambulatory Visit | Attending: Internal Medicine | Admitting: Internal Medicine

## 2016-07-19 DIAGNOSIS — E0840 Diabetes mellitus due to underlying condition with diabetic neuropathy, unspecified: Secondary | ICD-10-CM | POA: Diagnosis not present

## 2016-07-19 DIAGNOSIS — Z1231 Encounter for screening mammogram for malignant neoplasm of breast: Secondary | ICD-10-CM

## 2016-07-19 DIAGNOSIS — I10 Essential (primary) hypertension: Secondary | ICD-10-CM | POA: Diagnosis not present

## 2016-07-19 DIAGNOSIS — E663 Overweight: Secondary | ICD-10-CM | POA: Diagnosis not present

## 2016-07-19 DIAGNOSIS — E7800 Pure hypercholesterolemia, unspecified: Secondary | ICD-10-CM | POA: Diagnosis not present

## 2016-07-19 DIAGNOSIS — R350 Frequency of micturition: Secondary | ICD-10-CM | POA: Diagnosis not present

## 2016-07-19 DIAGNOSIS — E2839 Other primary ovarian failure: Secondary | ICD-10-CM | POA: Diagnosis not present

## 2016-07-19 DIAGNOSIS — Z7984 Long term (current) use of oral hypoglycemic drugs: Secondary | ICD-10-CM | POA: Diagnosis not present

## 2016-07-19 DIAGNOSIS — Z6836 Body mass index (BMI) 36.0-36.9, adult: Secondary | ICD-10-CM | POA: Diagnosis not present

## 2016-07-19 DIAGNOSIS — G629 Polyneuropathy, unspecified: Secondary | ICD-10-CM | POA: Diagnosis not present

## 2016-08-20 DIAGNOSIS — L603 Nail dystrophy: Secondary | ICD-10-CM | POA: Diagnosis not present

## 2016-08-20 DIAGNOSIS — I739 Peripheral vascular disease, unspecified: Secondary | ICD-10-CM | POA: Diagnosis not present

## 2016-08-20 DIAGNOSIS — E1151 Type 2 diabetes mellitus with diabetic peripheral angiopathy without gangrene: Secondary | ICD-10-CM | POA: Diagnosis not present

## 2016-08-24 DIAGNOSIS — H0015 Chalazion left lower eyelid: Secondary | ICD-10-CM | POA: Diagnosis not present

## 2016-11-19 DIAGNOSIS — E7800 Pure hypercholesterolemia, unspecified: Secondary | ICD-10-CM | POA: Diagnosis not present

## 2016-11-19 DIAGNOSIS — I10 Essential (primary) hypertension: Secondary | ICD-10-CM | POA: Diagnosis not present

## 2016-11-19 DIAGNOSIS — E1165 Type 2 diabetes mellitus with hyperglycemia: Secondary | ICD-10-CM | POA: Diagnosis not present

## 2016-11-19 DIAGNOSIS — Z7984 Long term (current) use of oral hypoglycemic drugs: Secondary | ICD-10-CM | POA: Diagnosis not present

## 2016-11-19 DIAGNOSIS — E0840 Diabetes mellitus due to underlying condition with diabetic neuropathy, unspecified: Secondary | ICD-10-CM | POA: Diagnosis not present

## 2016-11-19 DIAGNOSIS — G629 Polyneuropathy, unspecified: Secondary | ICD-10-CM | POA: Diagnosis not present

## 2016-11-26 ENCOUNTER — Ambulatory Visit (INDEPENDENT_AMBULATORY_CARE_PROVIDER_SITE_OTHER): Payer: Commercial Managed Care - HMO

## 2016-11-26 ENCOUNTER — Encounter (HOSPITAL_COMMUNITY): Payer: Self-pay | Admitting: Emergency Medicine

## 2016-11-26 ENCOUNTER — Ambulatory Visit (HOSPITAL_COMMUNITY)
Admission: EM | Admit: 2016-11-26 | Discharge: 2016-11-26 | Disposition: A | Discharge status: Home / Self Care | Payer: Commercial Managed Care - HMO | Attending: Emergency Medicine | Admitting: Emergency Medicine

## 2016-11-26 DIAGNOSIS — E1165 Type 2 diabetes mellitus with hyperglycemia: Secondary | ICD-10-CM | POA: Diagnosis not present

## 2016-11-26 DIAGNOSIS — K5901 Slow transit constipation: Secondary | ICD-10-CM

## 2016-11-26 DIAGNOSIS — K5900 Constipation, unspecified: Secondary | ICD-10-CM | POA: Diagnosis not present

## 2016-11-26 DIAGNOSIS — I10 Essential (primary) hypertension: Secondary | ICD-10-CM | POA: Insufficient documentation

## 2016-11-26 DIAGNOSIS — R110 Nausea: Secondary | ICD-10-CM | POA: Diagnosis not present

## 2016-11-26 DIAGNOSIS — R81 Glycosuria: Secondary | ICD-10-CM

## 2016-11-26 DIAGNOSIS — R109 Unspecified abdominal pain: Secondary | ICD-10-CM | POA: Insufficient documentation

## 2016-11-26 DIAGNOSIS — R141 Gas pain: Secondary | ICD-10-CM | POA: Diagnosis not present

## 2016-11-26 DIAGNOSIS — Z7984 Long term (current) use of oral hypoglycemic drugs: Secondary | ICD-10-CM | POA: Insufficient documentation

## 2016-11-26 DIAGNOSIS — R32 Unspecified urinary incontinence: Secondary | ICD-10-CM | POA: Diagnosis not present

## 2016-11-26 DIAGNOSIS — N390 Urinary tract infection, site not specified: Secondary | ICD-10-CM | POA: Diagnosis present

## 2016-11-26 DIAGNOSIS — R350 Frequency of micturition: Secondary | ICD-10-CM

## 2016-11-26 HISTORY — DX: Essential (primary) hypertension: I10

## 2016-11-26 HISTORY — DX: Type 2 diabetes mellitus without complications: E11.9

## 2016-11-26 LAB — POCT URINALYSIS DIP (DEVICE)
Bilirubin Urine: NEGATIVE
Glucose, UA: 500 mg/dL — AB
HGB URINE DIPSTICK: NEGATIVE
Ketones, ur: NEGATIVE mg/dL
Leukocytes, UA: NEGATIVE
Nitrite: NEGATIVE
PH: 5.5 (ref 5.0–8.0)
PROTEIN: NEGATIVE mg/dL
SPECIFIC GRAVITY, URINE: 1.025 (ref 1.005–1.030)
UROBILINOGEN UA: 1 mg/dL (ref 0.0–1.0)

## 2016-11-26 LAB — POCT I-STAT, CHEM 8
BUN: 17 mg/dL (ref 6–20)
CREATININE: 1.1 mg/dL — AB (ref 0.44–1.00)
Calcium, Ion: 1.22 mmol/L (ref 1.15–1.40)
Chloride: 99 mmol/L — ABNORMAL LOW (ref 101–111)
GLUCOSE: 275 mg/dL — AB (ref 65–99)
HCT: 38 % (ref 36.0–46.0)
HEMOGLOBIN: 12.9 g/dL (ref 12.0–15.0)
Potassium: 4.6 mmol/L (ref 3.5–5.1)
Sodium: 139 mmol/L (ref 135–145)
TCO2: 29 mmol/L (ref 0–100)

## 2016-11-26 NOTE — Discharge Instructions (Signed)
Your blood sugar is elevated and it is spilling into your urine causing increase blood sugar in the urine. This is causing increased urination and possibly poor control of your bladder. Talk to your doctor about improved control of your blood sugar. The abdominal pain is coming from constipation and abdominal gas. Use the MiraLAX as directed. One 6 ounce glass of diet Sprite or your choice of low sugar liquid  every 30-45 minutes 3 classes. If he do not have good results over the next 6 hours he can repeat the 3 doses of MiraLAX. For worsening, new symptoms or problems may return.

## 2016-11-26 NOTE — ED Provider Notes (Signed)
CSN: [id number]     Arrival date & time 11/26/16  4 History   First MD Initiated Contact with Patient 11/26/16 1900     Chief Complaint  Patient presents with  . Urinary Tract Infection   (Consider location/radiation/quality/duration/timing/severity/associated sxs/prior Treatment) 70 year old female with type 2 diabetes mellitus presents to the urgent care complaining of left abdominal pain for 3-4 days. The pain in the abdomen is constant although occasionally crampy. Nothing seems to make it better. Is also complaining of urinary incontinence and frequency. Denies dysuria. Positive for nausea but no vomiting. She states sometimes she is constipated. Surgical history includes abdominal hysterectomy and disc surgery.      Past Medical History:  Diagnosis Date  . Diabetes mellitus without complication ([place])   . Hypertension    Past Surgical History:  Procedure Laterality Date  . ABDOMINAL HYSTERECTOMY    . BACK SURGERY     No family history on file. Social History  Substance Use Topics  . Smoking status: Never Smoker  . Smokeless tobacco: Never Used  . Alcohol use No   OB History    No data available     Review of Systems  Constitutional: Positive for activity change. Negative for fever.  HENT: Negative.   Respiratory: Negative.  Negative for cough and shortness of breath.   Cardiovascular: Negative for chest pain and leg swelling.  Gastrointestinal: Positive for abdominal pain, constipation and nausea. Negative for vomiting.  Genitourinary: Positive for frequency and urgency. Negative for dysuria, pelvic pain and vaginal bleeding.       Urinary incontinence  Skin: Negative.   Neurological: Negative.     Allergies  Patient has no known allergies.  Home Medications   Prior to Admission medications   Medication Sig Start Date End Date Taking? Authorizing Provider  lisinopril (PRINIVIL,ZESTRIL) 40 MG tablet TAKE ONE TABLET BY MOUTH EVERY DAY IN THE MORNING  08/16/11   Liliane Shi, PA-C  metFORMIN (GLUCOPHAGE) 1000 MG tablet TAKE ONE TABLET BY MOUTH TWICE DAILY 08/16/11   Liliane Shi, PA-C   Meds Ordered and Administered this Visit  Medications - No data to display  BP 147/78   Pulse 85   Temp 98.4 F (36.9 C) (Oral)   Resp 16   Ht 5\' 5"  (1.651 m)   Wt 197 lb (89.4 kg)   SpO2 98%   BMI 32.78 kg/m  No data found.   Physical Exam  Constitutional: She is oriented to person, place, and time. She appears well-developed and well-nourished. No distress.  HENT:  Head: Normocephalic and atraumatic.  Neck: Neck supple.  Cardiovascular: Normal rate and regular rhythm.   Pulmonary/Chest: Effort normal and breath sounds normal.  Abdominal: Soft. Bowel sounds are normal. There is no guarding. No hernia.  Minor distention in the upper abdomen. Upper abdomen percusses tympanic in the mid and right upper quadrant. Tenderness to the same areas. No tenderness below the umbilicus. No palpable masses. No rebound.  Musculoskeletal: She exhibits no edema.  Neurological: She is alert and oriented to person, place, and time.  Skin: Skin is warm and dry.  Psychiatric: She has a normal mood and affect.  Nursing note and vitals reviewed.   Urgent Care Course   Clinical Course     Procedures (including critical care time)  Labs Review Labs Reviewed  POCT URINALYSIS DIP (DEVICE) - Abnormal; Notable for the following:       Result Value   Glucose, UA 500 (*)    All  other components within normal limits  POCT I-STAT, CHEM 8 - Abnormal; Notable for the following:    Chloride 99 (*)    Creatinine, Ser 1.10 (*)    Glucose, Bld 275 (*)    All other components within normal limits  URINE CULTURE   Results for orders placed or performed during the hospital encounter of 11/26/16  POCT urinalysis dip (device)  Result Value Ref Range   Glucose, UA 500 (A) NEGATIVE mg/dL   Bilirubin Urine NEGATIVE NEGATIVE   Ketones, ur NEGATIVE NEGATIVE mg/dL    Specific Gravity, Urine 1.025 1.005 - 1.030   Hgb urine dipstick NEGATIVE NEGATIVE   pH 5.5 5.0 - 8.0   Protein, ur NEGATIVE NEGATIVE mg/dL   Urobilinogen, UA 1.0 0.0 - 1.0 mg/dL   Nitrite NEGATIVE NEGATIVE   Leukocytes, UA NEGATIVE NEGATIVE  I-STAT, chem 8  Result Value Ref Range   Sodium 139 135 - 145 mmol/L   Potassium 4.6 3.5 - 5.1 mmol/L   Chloride 99 (L) 101 - 111 mmol/L   BUN 17 6 - 20 mg/dL   Creatinine, Ser 1.10 (H) 0.44 - 1.00 mg/dL   Glucose, Bld 275 (H) 65 - 99 mg/dL   Calcium, Ion 1.22 1.15 - 1.40 mmol/L   TCO2 29 0 - 100 mmol/L   Hemoglobin 12.9 12.0 - 15.0 g/dL   HCT 38.0 36.0 - 46.0 %     Imaging Review Dg Abd 2 Views  Result Date: 11/26/2016 CLINICAL DATA:  Abdominal pain with nausea for 3 days EXAM: ABDOMEN - 2 VIEW COMPARISON:  None. FINDINGS: Visualized lung bases are clear. No free air beneath the diaphragm. Nonobstructed bowel gas pattern with scattered stool in the colon. Probable calcified right pelvic phlebolith. Mild irregular calcification right hemipelvis could be vascular in etiology. No acute osseous abnormality. IMPRESSION: Nonobstructed bowel-gas pattern Electronically Signed   By: Donavan Foil M.D.   On: 11/26/2016 20:01     Visual Acuity Review  Right Eye Distance:   Left Eye Distance:   Bilateral Distance:    Right Eye Near:   Left Eye Near:    Bilateral Near:         MDM   1. Urinary frequency   2. Glycosuria   3. Type 2 diabetes mellitus with hyperglycemia, without long-term current use of insulin (HCC)   4. Abdominal gas pain   5. Slow transit constipation    Your blood sugar is elevated and it is spilling into your urine causing increase blood sugar in the urine. This is causing increased urination and possibly poor control of your bladder. Talk to your doctor about improved control of your blood sugar. The abdominal pain is coming from constipation and abdominal gas. Use the MiraLAX as directed. One 6 ounce glass of diet  Sprite or your choice of low sugar liquid  every 30-45 minutes 3 classes. If he do not have good results over the next 6 hours he can repeat the 3 doses of MiraLAX. For worsening, new symptoms or problems may return.     Janne Napoleon, NP 11/26/16 2018

## 2016-11-26 NOTE — ED Triage Notes (Signed)
PT reports urinary frequency and inability to make it to the bathroom in time for 3-4 days. PT also reports abdominal pain.

## 2016-11-28 LAB — URINE CULTURE

## 2016-12-02 ENCOUNTER — Telehealth (HOSPITAL_COMMUNITY): Payer: Self-pay | Admitting: Emergency Medicine

## 2016-12-02 NOTE — Telephone Encounter (Signed)
-----   Message from Sherlene Shams, MD sent at 11/30/2016  9:02 AM EST ----- Please let patient know that urine culture does not clearly demonstrate a UTI.  Recheck or followup with primary care provider for further evaluation if abd pain/urinary frequency persist.  LM

## 2016-12-02 NOTE — Telephone Encounter (Signed)
Called pt and notified of recent lab results Pt ID'd properly... Reports feeling better Adv pt if sx are not getting better to return or to f/u w/PCP Pt verb understanding.      

## 2016-12-28 DIAGNOSIS — H524 Presbyopia: Secondary | ICD-10-CM | POA: Diagnosis not present

## 2016-12-28 DIAGNOSIS — E119 Type 2 diabetes mellitus without complications: Secondary | ICD-10-CM | POA: Diagnosis not present

## 2017-01-25 DIAGNOSIS — I10 Essential (primary) hypertension: Secondary | ICD-10-CM | POA: Diagnosis not present

## 2017-01-25 DIAGNOSIS — G629 Polyneuropathy, unspecified: Secondary | ICD-10-CM | POA: Diagnosis not present

## 2017-01-25 DIAGNOSIS — E0840 Diabetes mellitus due to underlying condition with diabetic neuropathy, unspecified: Secondary | ICD-10-CM | POA: Diagnosis not present

## 2017-01-25 DIAGNOSIS — E1141 Type 2 diabetes mellitus with diabetic mononeuropathy: Secondary | ICD-10-CM | POA: Diagnosis not present

## 2017-01-25 DIAGNOSIS — E7800 Pure hypercholesterolemia, unspecified: Secondary | ICD-10-CM | POA: Diagnosis not present

## 2017-01-25 DIAGNOSIS — Z1389 Encounter for screening for other disorder: Secondary | ICD-10-CM | POA: Diagnosis not present

## 2017-01-25 DIAGNOSIS — E1165 Type 2 diabetes mellitus with hyperglycemia: Secondary | ICD-10-CM | POA: Diagnosis not present

## 2017-01-25 DIAGNOSIS — Z0000 Encounter for general adult medical examination without abnormal findings: Secondary | ICD-10-CM | POA: Diagnosis not present

## 2017-01-25 DIAGNOSIS — Z6835 Body mass index (BMI) 35.0-35.9, adult: Secondary | ICD-10-CM | POA: Diagnosis not present

## 2017-01-25 DIAGNOSIS — Z7984 Long term (current) use of oral hypoglycemic drugs: Secondary | ICD-10-CM | POA: Diagnosis not present

## 2017-01-25 DIAGNOSIS — E663 Overweight: Secondary | ICD-10-CM | POA: Diagnosis not present

## 2017-02-07 DIAGNOSIS — R197 Diarrhea, unspecified: Secondary | ICD-10-CM | POA: Diagnosis not present

## 2017-02-07 DIAGNOSIS — J209 Acute bronchitis, unspecified: Secondary | ICD-10-CM | POA: Diagnosis not present

## 2017-02-21 ENCOUNTER — Other Ambulatory Visit: Payer: Self-pay | Admitting: Internal Medicine

## 2017-02-21 ENCOUNTER — Ambulatory Visit
Admission: RE | Admit: 2017-02-21 | Discharge: 2017-02-21 | Disposition: A | Discharge status: Home / Self Care | Payer: Medicare HMO | Source: Ambulatory Visit | Attending: Internal Medicine | Admitting: Internal Medicine

## 2017-02-21 DIAGNOSIS — J209 Acute bronchitis, unspecified: Secondary | ICD-10-CM

## 2017-02-21 DIAGNOSIS — R05 Cough: Secondary | ICD-10-CM | POA: Diagnosis not present

## 2017-02-21 DIAGNOSIS — R0602 Shortness of breath: Secondary | ICD-10-CM | POA: Diagnosis not present

## 2017-04-11 DIAGNOSIS — I739 Peripheral vascular disease, unspecified: Secondary | ICD-10-CM | POA: Diagnosis not present

## 2017-04-11 DIAGNOSIS — L603 Nail dystrophy: Secondary | ICD-10-CM | POA: Diagnosis not present

## 2017-04-11 DIAGNOSIS — E1151 Type 2 diabetes mellitus with diabetic peripheral angiopathy without gangrene: Secondary | ICD-10-CM | POA: Diagnosis not present

## 2017-04-27 ENCOUNTER — Encounter (HOSPITAL_COMMUNITY): Payer: Self-pay | Admitting: Emergency Medicine

## 2017-04-27 ENCOUNTER — Ambulatory Visit (HOSPITAL_COMMUNITY)
Admission: EM | Admit: 2017-04-27 | Discharge: 2017-04-27 | Disposition: A | Discharge status: Home / Self Care | Payer: Medicare HMO

## 2017-04-27 DIAGNOSIS — R233 Spontaneous ecchymoses: Secondary | ICD-10-CM | POA: Diagnosis not present

## 2017-04-27 DIAGNOSIS — M25522 Pain in left elbow: Secondary | ICD-10-CM | POA: Diagnosis not present

## 2017-04-27 DIAGNOSIS — W19XXXA Unspecified fall, initial encounter: Secondary | ICD-10-CM | POA: Diagnosis not present

## 2017-04-27 DIAGNOSIS — M79605 Pain in left leg: Secondary | ICD-10-CM | POA: Diagnosis not present

## 2017-04-27 DIAGNOSIS — R58 Hemorrhage, not elsewhere classified: Secondary | ICD-10-CM

## 2017-04-27 NOTE — Discharge Instructions (Signed)
You are safe to be discharged home I do not think xray is needed at this moment.  You may apply heat to the elbow but the bruising will get better Take tylenol for pain relief.

## 2017-04-27 NOTE — ED Triage Notes (Signed)
Pt reports she fell from bed while sleeping 2 days ago  Has a contusion to left elbow and left hip/leg  Steady gait... A&O x4... NAD

## 2017-04-27 NOTE — ED Provider Notes (Signed)
CSN: [id number]     Arrival date & time 04/27/17  1410 History   First MD Initiated Contact with Patient 04/27/17 1516     Chief Complaint  Patient presents with  . Arm Injury   (Consider location/radiation/quality/duration/timing/severity/associated sxs/prior Treatment) Patient is a 71 y.o. Female, with hx of T2DM and HTN, woke up 2 day ago on the floor. She states that she probably fell in her sleep. This has happened a few times in the past. This is not unusual for her.  She reports that she gets bad dreams sometimes. She states that sometimes she will be fighting in her sleep and wake up beating the wall and that she talks in her sleep. She reports that she came in today because her family member is telling her to do so. She reports pain at her left elbow at the site of the bruise. She also reports some pain at the left upper leg. She denies hip pain. She reports full ROM and able to walk.        Past Medical History:  Diagnosis Date  . Diabetes mellitus without complication ([place])   . Hypertension    Past Surgical History:  Procedure Laterality Date  . ABDOMINAL HYSTERECTOMY    . BACK SURGERY     History reviewed. No pertinent family history. Social History  Substance Use Topics  . Smoking status: Never Smoker  . Smokeless tobacco: Never Used  . Alcohol use No   OB History    No data available     Review of Systems  Constitutional:       As stated in the HPI    Allergies  Patient has no known allergies.  Home Medications   Prior to Admission medications   Medication Sig Start Date End Date Taking? Authorizing Provider  lisinopril (PRINIVIL,ZESTRIL) 40 MG tablet TAKE ONE TABLET BY MOUTH EVERY DAY IN THE MORNING 08/16/11  Yes Liliane Shi, PA-C  metFORMIN (GLUCOPHAGE) 1000 MG tablet TAKE ONE TABLET BY MOUTH TWICE DAILY 08/16/11  Yes Liliane Shi, PA-C   Meds Ordered and Administered this Visit  Medications - No data to display  BP 136/77 (BP Location: Right  Arm)   Pulse 78   Temp 97.5 F (36.4 C) (Oral)   Resp 16   SpO2 96%  No data found.   Physical Exam  Constitutional: She is oriented to person, place, and time. She appears well-developed and well-nourished.  Cardiovascular: Normal rate, regular rhythm and normal heart sounds.   Pulmonary/Chest: Effort normal and breath sounds normal. She has no wheezes.  Abdominal: Soft. Bowel sounds are normal. She exhibits no distension. There is no tenderness.  Neurological: She is alert and oriented to person, place, and time. She displays normal reflexes. No cranial nerve deficit. She exhibits normal muscle tone. Coordination normal.  Skin:  Has large area of ecchymosis measures 8 cm x 4 cm near the left elbow. Elbow has full ROM and has no tenderness on palpation. Patient is ambulatory. No abnormality noted on the left upper leg.   Nursing note and vitals reviewed.   Urgent Care Course     Procedures (including critical care time)  Labs Review Labs Reviewed - No data to display  Imaging Review No results found.  MDM   1. Fall, initial encounter   2. Left elbow pain   3. Ecchymosis    Clinical appears well and she is neurologically intact. Patient is safe to be discharged home. I do not feel  that xray is needed at the moment; she has full ROM and has no tenderness on her bony prominence. Patient informed that her bruising will improve. Instructed to take tylenol for pain relief. Return precaution discussed.     Barry Dienes, NP 04/27/17 1556

## 2017-06-11 ENCOUNTER — Other Ambulatory Visit: Payer: Self-pay | Admitting: Internal Medicine

## 2017-06-11 DIAGNOSIS — Z1231 Encounter for screening mammogram for malignant neoplasm of breast: Secondary | ICD-10-CM

## 2017-06-26 ENCOUNTER — Other Ambulatory Visit: Payer: Self-pay | Admitting: Internal Medicine

## 2017-06-26 DIAGNOSIS — K921 Melena: Secondary | ICD-10-CM | POA: Diagnosis not present

## 2017-06-26 DIAGNOSIS — K922 Gastrointestinal hemorrhage, unspecified: Secondary | ICD-10-CM | POA: Diagnosis not present

## 2017-06-27 ENCOUNTER — Ambulatory Visit
Admission: RE | Admit: 2017-06-27 | Discharge: 2017-06-27 | Disposition: A | Discharge status: Home / Self Care | Payer: Medicare HMO | Source: Ambulatory Visit | Attending: Internal Medicine | Admitting: Internal Medicine

## 2017-06-27 DIAGNOSIS — K922 Gastrointestinal hemorrhage, unspecified: Secondary | ICD-10-CM

## 2017-06-27 DIAGNOSIS — K921 Melena: Secondary | ICD-10-CM | POA: Diagnosis not present

## 2017-07-05 DIAGNOSIS — L603 Nail dystrophy: Secondary | ICD-10-CM | POA: Diagnosis not present

## 2017-07-05 DIAGNOSIS — I739 Peripheral vascular disease, unspecified: Secondary | ICD-10-CM | POA: Diagnosis not present

## 2017-07-05 DIAGNOSIS — E1151 Type 2 diabetes mellitus with diabetic peripheral angiopathy without gangrene: Secondary | ICD-10-CM | POA: Diagnosis not present

## 2017-07-12 DIAGNOSIS — K922 Gastrointestinal hemorrhage, unspecified: Secondary | ICD-10-CM | POA: Diagnosis not present

## 2017-07-16 DIAGNOSIS — D649 Anemia, unspecified: Secondary | ICD-10-CM | POA: Diagnosis not present

## 2017-07-16 DIAGNOSIS — Z8719 Personal history of other diseases of the digestive system: Secondary | ICD-10-CM | POA: Diagnosis not present

## 2017-07-22 ENCOUNTER — Ambulatory Visit
Admission: RE | Admit: 2017-07-22 | Discharge: 2017-07-22 | Disposition: A | Discharge status: Home / Self Care | Payer: Medicare HMO | Source: Ambulatory Visit | Attending: Internal Medicine | Admitting: Internal Medicine

## 2017-07-22 DIAGNOSIS — Z1231 Encounter for screening mammogram for malignant neoplasm of breast: Secondary | ICD-10-CM

## 2017-07-26 DIAGNOSIS — K922 Gastrointestinal hemorrhage, unspecified: Secondary | ICD-10-CM | POA: Diagnosis not present

## 2017-07-26 DIAGNOSIS — Z6834 Body mass index (BMI) 34.0-34.9, adult: Secondary | ICD-10-CM | POA: Diagnosis not present

## 2017-07-26 DIAGNOSIS — E663 Overweight: Secondary | ICD-10-CM | POA: Diagnosis not present

## 2017-07-26 DIAGNOSIS — I10 Essential (primary) hypertension: Secondary | ICD-10-CM | POA: Diagnosis not present

## 2017-07-26 DIAGNOSIS — E7800 Pure hypercholesterolemia, unspecified: Secondary | ICD-10-CM | POA: Diagnosis not present

## 2017-07-26 DIAGNOSIS — E1165 Type 2 diabetes mellitus with hyperglycemia: Secondary | ICD-10-CM | POA: Diagnosis not present

## 2017-07-26 DIAGNOSIS — E0840 Diabetes mellitus due to underlying condition with diabetic neuropathy, unspecified: Secondary | ICD-10-CM | POA: Diagnosis not present

## 2017-08-08 DIAGNOSIS — K921 Melena: Secondary | ICD-10-CM | POA: Diagnosis not present

## 2017-08-08 DIAGNOSIS — K449 Diaphragmatic hernia without obstruction or gangrene: Secondary | ICD-10-CM | POA: Diagnosis not present

## 2017-08-08 DIAGNOSIS — K3189 Other diseases of stomach and duodenum: Secondary | ICD-10-CM | POA: Diagnosis not present

## 2017-08-09 DIAGNOSIS — K3189 Other diseases of stomach and duodenum: Secondary | ICD-10-CM | POA: Diagnosis not present

## 2017-08-22 DIAGNOSIS — R194 Change in bowel habit: Secondary | ICD-10-CM | POA: Diagnosis not present

## 2017-08-22 DIAGNOSIS — K921 Melena: Secondary | ICD-10-CM | POA: Diagnosis not present

## 2017-08-22 DIAGNOSIS — D649 Anemia, unspecified: Secondary | ICD-10-CM | POA: Diagnosis not present

## 2017-09-05 DIAGNOSIS — K922 Gastrointestinal hemorrhage, unspecified: Secondary | ICD-10-CM | POA: Diagnosis not present

## 2017-09-05 DIAGNOSIS — K5730 Diverticulosis of large intestine without perforation or abscess without bleeding: Secondary | ICD-10-CM | POA: Diagnosis not present

## 2017-09-05 DIAGNOSIS — K648 Other hemorrhoids: Secondary | ICD-10-CM | POA: Diagnosis not present

## 2017-09-05 DIAGNOSIS — K921 Melena: Secondary | ICD-10-CM | POA: Diagnosis not present

## 2017-10-07 DIAGNOSIS — L84 Corns and callosities: Secondary | ICD-10-CM | POA: Diagnosis not present

## 2017-10-07 DIAGNOSIS — L603 Nail dystrophy: Secondary | ICD-10-CM | POA: Diagnosis not present

## 2017-10-07 DIAGNOSIS — I739 Peripheral vascular disease, unspecified: Secondary | ICD-10-CM | POA: Diagnosis not present

## 2017-10-07 DIAGNOSIS — E1151 Type 2 diabetes mellitus with diabetic peripheral angiopathy without gangrene: Secondary | ICD-10-CM | POA: Diagnosis not present

## 2017-12-30 DIAGNOSIS — E119 Type 2 diabetes mellitus without complications: Secondary | ICD-10-CM | POA: Diagnosis not present

## 2017-12-30 DIAGNOSIS — H401132 Primary open-angle glaucoma, bilateral, moderate stage: Secondary | ICD-10-CM | POA: Diagnosis not present

## 2017-12-30 DIAGNOSIS — H524 Presbyopia: Secondary | ICD-10-CM | POA: Diagnosis not present

## 2018-01-08 DIAGNOSIS — D649 Anemia, unspecified: Secondary | ICD-10-CM | POA: Diagnosis not present

## 2018-01-08 DIAGNOSIS — R194 Change in bowel habit: Secondary | ICD-10-CM | POA: Diagnosis not present

## 2018-01-08 DIAGNOSIS — K921 Melena: Secondary | ICD-10-CM | POA: Diagnosis not present

## 2018-01-09 DIAGNOSIS — L603 Nail dystrophy: Secondary | ICD-10-CM | POA: Diagnosis not present

## 2018-01-09 DIAGNOSIS — E1151 Type 2 diabetes mellitus with diabetic peripheral angiopathy without gangrene: Secondary | ICD-10-CM | POA: Diagnosis not present

## 2018-01-09 DIAGNOSIS — I739 Peripheral vascular disease, unspecified: Secondary | ICD-10-CM | POA: Diagnosis not present

## 2018-01-09 DIAGNOSIS — L84 Corns and callosities: Secondary | ICD-10-CM | POA: Diagnosis not present

## 2018-01-31 DIAGNOSIS — Z7189 Other specified counseling: Secondary | ICD-10-CM | POA: Diagnosis not present

## 2018-01-31 DIAGNOSIS — E7800 Pure hypercholesterolemia, unspecified: Secondary | ICD-10-CM | POA: Diagnosis not present

## 2018-01-31 DIAGNOSIS — E663 Overweight: Secondary | ICD-10-CM | POA: Diagnosis not present

## 2018-01-31 DIAGNOSIS — E1140 Type 2 diabetes mellitus with diabetic neuropathy, unspecified: Secondary | ICD-10-CM | POA: Diagnosis not present

## 2018-01-31 DIAGNOSIS — I10 Essential (primary) hypertension: Secondary | ICD-10-CM | POA: Diagnosis not present

## 2018-01-31 DIAGNOSIS — Z1389 Encounter for screening for other disorder: Secondary | ICD-10-CM | POA: Diagnosis not present

## 2018-01-31 DIAGNOSIS — G629 Polyneuropathy, unspecified: Secondary | ICD-10-CM | POA: Diagnosis not present

## 2018-01-31 DIAGNOSIS — Z0000 Encounter for general adult medical examination without abnormal findings: Secondary | ICD-10-CM | POA: Diagnosis not present

## 2018-01-31 DIAGNOSIS — E2839 Other primary ovarian failure: Secondary | ICD-10-CM | POA: Diagnosis not present

## 2018-03-31 DIAGNOSIS — E2839 Other primary ovarian failure: Secondary | ICD-10-CM | POA: Diagnosis not present

## 2018-04-10 DIAGNOSIS — I739 Peripheral vascular disease, unspecified: Secondary | ICD-10-CM | POA: Diagnosis not present

## 2018-04-10 DIAGNOSIS — E1151 Type 2 diabetes mellitus with diabetic peripheral angiopathy without gangrene: Secondary | ICD-10-CM | POA: Diagnosis not present

## 2018-04-10 DIAGNOSIS — L84 Corns and callosities: Secondary | ICD-10-CM | POA: Diagnosis not present

## 2018-04-10 DIAGNOSIS — L603 Nail dystrophy: Secondary | ICD-10-CM | POA: Diagnosis not present

## 2018-06-18 ENCOUNTER — Other Ambulatory Visit: Payer: Self-pay | Admitting: Internal Medicine

## 2018-06-18 DIAGNOSIS — Z1231 Encounter for screening mammogram for malignant neoplasm of breast: Secondary | ICD-10-CM

## 2018-07-17 DIAGNOSIS — I739 Peripheral vascular disease, unspecified: Secondary | ICD-10-CM | POA: Diagnosis not present

## 2018-07-17 DIAGNOSIS — E1151 Type 2 diabetes mellitus with diabetic peripheral angiopathy without gangrene: Secondary | ICD-10-CM | POA: Diagnosis not present

## 2018-07-17 DIAGNOSIS — L603 Nail dystrophy: Secondary | ICD-10-CM | POA: Diagnosis not present

## 2018-07-17 DIAGNOSIS — L84 Corns and callosities: Secondary | ICD-10-CM | POA: Diagnosis not present

## 2018-07-23 ENCOUNTER — Ambulatory Visit
Admission: RE | Admit: 2018-07-23 | Discharge: 2018-07-23 | Disposition: A | Discharge status: Home / Self Care | Payer: Medicare HMO | Source: Ambulatory Visit | Attending: Internal Medicine | Admitting: Internal Medicine

## 2018-07-23 DIAGNOSIS — Z1231 Encounter for screening mammogram for malignant neoplasm of breast: Secondary | ICD-10-CM

## 2018-07-24 ENCOUNTER — Other Ambulatory Visit: Payer: Self-pay | Admitting: Internal Medicine

## 2018-07-24 DIAGNOSIS — R928 Other abnormal and inconclusive findings on diagnostic imaging of breast: Secondary | ICD-10-CM

## 2018-07-30 ENCOUNTER — Other Ambulatory Visit: Payer: Self-pay | Admitting: Internal Medicine

## 2018-07-30 ENCOUNTER — Ambulatory Visit
Admission: RE | Admit: 2018-07-30 | Discharge: 2018-07-30 | Disposition: A | Discharge status: Home / Self Care | Payer: Medicare HMO | Source: Ambulatory Visit | Attending: Internal Medicine | Admitting: Internal Medicine

## 2018-07-30 DIAGNOSIS — N6320 Unspecified lump in the left breast, unspecified quadrant: Secondary | ICD-10-CM

## 2018-07-30 DIAGNOSIS — R928 Other abnormal and inconclusive findings on diagnostic imaging of breast: Secondary | ICD-10-CM | POA: Diagnosis not present

## 2018-07-31 ENCOUNTER — Ambulatory Visit
Admission: RE | Admit: 2018-07-31 | Discharge: 2018-07-31 | Disposition: A | Discharge status: Home / Self Care | Payer: Medicare HMO | Source: Ambulatory Visit | Attending: Internal Medicine | Admitting: Internal Medicine

## 2018-07-31 ENCOUNTER — Other Ambulatory Visit: Payer: Self-pay | Admitting: Internal Medicine

## 2018-07-31 DIAGNOSIS — N6320 Unspecified lump in the left breast, unspecified quadrant: Secondary | ICD-10-CM

## 2018-07-31 DIAGNOSIS — N6323 Unspecified lump in the left breast, lower outer quadrant: Secondary | ICD-10-CM | POA: Diagnosis not present

## 2018-07-31 DIAGNOSIS — D242 Benign neoplasm of left breast: Secondary | ICD-10-CM | POA: Diagnosis not present

## 2018-08-01 DIAGNOSIS — Z6834 Body mass index (BMI) 34.0-34.9, adult: Secondary | ICD-10-CM | POA: Diagnosis not present

## 2018-08-01 DIAGNOSIS — I10 Essential (primary) hypertension: Secondary | ICD-10-CM | POA: Diagnosis not present

## 2018-08-01 DIAGNOSIS — E7800 Pure hypercholesterolemia, unspecified: Secondary | ICD-10-CM | POA: Diagnosis not present

## 2018-08-01 DIAGNOSIS — R928 Other abnormal and inconclusive findings on diagnostic imaging of breast: Secondary | ICD-10-CM | POA: Diagnosis not present

## 2018-08-01 DIAGNOSIS — E663 Overweight: Secondary | ICD-10-CM | POA: Diagnosis not present

## 2018-08-01 DIAGNOSIS — E1169 Type 2 diabetes mellitus with other specified complication: Secondary | ICD-10-CM | POA: Diagnosis not present

## 2018-08-01 DIAGNOSIS — Z7984 Long term (current) use of oral hypoglycemic drugs: Secondary | ICD-10-CM | POA: Diagnosis not present

## 2018-08-18 ENCOUNTER — Ambulatory Visit: Payer: Self-pay | Admitting: Surgery

## 2018-08-18 DIAGNOSIS — D242 Benign neoplasm of left breast: Secondary | ICD-10-CM | POA: Diagnosis not present

## 2018-08-18 NOTE — H&P (View-Only) (Signed)
Jessica Jacobs Documented: 08/18/2018 1:49 PM Location: [facility] Patient #: [id number] DOB: [birth year] Single / Language: Jessica Jacobs / Race: Black or African American Female  History of Present Illness Marcello Moores A. Nadie Fiumara MD; 08/18/2018 3:56 PM) Patient words: Patient said at the request of Dr. Dorise Bullion for normal mammogram. She was noted to have a density left breast upper quadrant. Compression views and core biopsy revealed papilloma. The patient denies any history of breast pain, nipple discharge, breast mass, for family history of breast problems in general.                               CLINICAL DATA: Screening. EXAM: DIGITAL SCREENING BILATERAL MAMMOGRAM WITH TOMO AND CAD COMPARISON: Previous exam(s). ACR Breast Density Category a: The breast tissue is almost entirely fatty. FINDINGS: In the left breast, a possible mass warrants further evaluation. In the right breast, no findings suspicious for malignancy. Images were processed with CAD. IMPRESSION: Further evaluation is suggested for possible mass in the left breast. RECOMMENDATION: Diagnostic mammogram and possibly ultrasound of the left breast. (Code:FI-L-80M) The patient will be contacted regarding the findings, and additional imaging will be scheduled. BI-RADS CATEGORY 0: Incomplete. Need additional imaging evaluation and/or prior mammograms for comparison. Electronically Signed By: Lajean Manes M.D. On: 07/23/2018 15:43  Result History  MM 3D SCREEN BREAST BILATERAL (Order #[id number]) on 07/23/2018 - Order Result History Report <epic://[email or web address]>  US BREAST LTD UNI LEFT INC AXILLA (Order [id number]) - Reflex for Order [id number] Study Result  CLINICAL DATA: Screening recall for a left mass. EXAM: DIGITAL DIAGNOSTIC UNILATERAL LEFT MAMMOGRAM WITH CAD AND TOMO LEFT BREAST ULTRASOUND COMPARISON: Previous exam(s). ACR Breast Density Category a:  The breast tissue is almost entirely fatty. FINDINGS: There is a persistent 4 mm oval circumscribed mass in the lower inner posterior depth of the left breast. Mammographic images were processed with CAD. Ultrasound of the left breast at 8:30, 6 cm from the nipple demonstrates a mixed cystic and solid mass measuring 5 x 3 x 5 mm. The solid portion of the mass may represent adherent mural debris versus a soft tissue growth. IMPRESSION: There is an indeterminate mass in the left breast at 8:30. RECOMMENDATION: Ultrasound-guided biopsy is recommended for the left breast mass. This has been scheduled for 07/31/2018 at 3:45 p.m. I have discussed the findings and recommendations with the patient. Results were also provided in writing at the conclusion of the visit. If applicable, a reminder letter will be sent to the patient regarding the next appointment. BI-RADS CATEGORY 4: Suspicious. Electronically Signed By: Ammie Ferrier M.D. On: 07/30/2018 13:20  Result History  US BREAST LTD UNI LEFT INC AXILLA (Order #[id number]) on 07/30/2018 - Order Result History Report <epic://OPTION/?[email or web address]>  MM DIAG BREAST TOMO UNI LEFT (Order [id number]) - Reflex for Order [id number] Study Result  CLINICAL DATA: Screening recall for a left mass. EXAM: DIGITAL DIAGNOSTIC UNILATERAL LEFT MAMMOGRAM WITH CAD AND TOMO LEFT BREAST ULTRASOUND COMPARISON: Previous exam(s). ACR Breast Density Category a: The breast tissue is almost entirely fatty. FINDINGS: There is a persistent 4 mm oval circumscribed mass in the lower inner posterior depth of the left breast. Mammographic images were processed with CAD. Ultrasound of the left breast at 8:30, 6 cm from the nipple demonstrates a mixed cystic and solid mass measuring 5 x 3 x 5 mm. The solid portion of the mass may represent adherent mural debris versus a soft tissue growth. IMPRESSION: There is  an indeterminate mass in the left breast at 8:30.  RECOMMENDATION: Ultrasound-guided biopsy is recommended for the left breast mass. This has been scheduled for 07/31/2018 at 3:45 p.m. I have discussed the findings and recommendations with the patient. Results were also provided in writing at the conclusion of the visit. If applicable, a reminder letter will be sent to the patient regarding the next appointment. BI-RADS CATEGORY 4: Suspicious. Electronically Signed By: Ammie Ferrier M.D. On: 07/30/2018 13:20                  Diagnosis Breast, left, needle core biopsy, 8:30 o'clock - INTRADUCTAL PAPILLOMA WITH APOCRINE CHANGE. SEE NOTE. - NEGATIVE FOR CARCINOMA.  The patient is a 72 year old female.   Allergies (Hadelyn Massenburg, LPN; 0/86/7619 5:09 PM) No Known Drug Allergies [01/19/2015]: Allergies Reconciled  Medication History (Hadelyn Massenburg, LPN; 03/26/7123 5:80 PM) Amoxicillin-Pot Clavulanate (875-125MG  Tablet, Oral two times daily) Active. Lisinopril (40MG  Tablet, Oral daily) Active. MetFORMIN HCl ER (OSM) (1000MG  Tablet ER 24HR, Oral daily) Active. GlyBURIDE (5MG  Tablet, Oral daily) Active. Hydrochlorothiazide (25MG  Tablet, Oral daily) Active. Lovastatin (40MG  Tablet, Oral daily) Active. Medications Reconciled    Vitals (Hadelyn Massenburg LPN; 9/98/3382 5:05 PM) 08/18/2018 1:49 PM Weight: 190.38 lb Height: 65.5in Body Surface Area: 1.95 m Body Mass Index: 31.2 kg/m  Temp.: 98.69F(Oral)  Pulse: 75 (Regular)  Resp.: 18 (Unlabored)  P.OX: 98% (Room air) BP: 128/82 (Sitting, Left Arm, Standard)      Physical Exam (Kallista Pae A. Ahmari Garton MD; 08/18/2018 3:59 PM)  General Mental Status-Alert. General Appearance-Consistent with stated age. Hydration-Well hydrated. Voice-Normal.  Head and Neck Head-normocephalic, atraumatic with no lesions or palpable masses. Trachea-midline. Thyroid Gland Characteristics - normal size and  consistency.  Eye Eyeball - Bilateral-Extraocular movements intact. Sclera/Conjunctiva - Bilateral-No scleral icterus.  Chest and Lung Exam Chest and lung exam reveals -quiet, even and easy respiratory effort with no use of accessory muscles and on auscultation, normal breath sounds, no adventitious sounds and normal vocal resonance. Inspection Chest Wall - Normal. Back - normal.  Breast Breast - Left-Symmetric, Non Tender, No Biopsy scars, no Dimpling, No Inflammation, No Lumpectomy scars, No Mastectomy scars, No Peau d' Orange. Breast - Right-Symmetric, Non Tender, No Biopsy scars, no Dimpling, No Inflammation, No Lumpectomy scars, No Mastectomy scars, No Peau d' Orange. Breast Lump-No Palpable Breast Mass.  Cardiovascular Cardiovascular examination reveals -normal heart sounds, regular rate and rhythm with no murmurs and normal pedal pulses bilaterally.  Musculoskeletal Normal Exam - Left-Upper Extremity Strength Normal and Lower Extremity Strength Normal. Normal Exam - Right-Upper Extremity Strength Normal and Lower Extremity Strength Normal.  Lymphatic Head & Neck  General Head & Neck Lymphatics: Bilateral - Description - Normal. Axillary  General Axillary Region: Bilateral - Description - Normal. Tenderness - Non Tender.    Assessment & Plan (Lucita Montoya A. Verline Kong MD; 08/18/2018 3:59 PM)  PAPILLOMA OF LEFT BREAST (D24.2) Impression: Recommend left breast lumpectomy to exclude potential malignancy / early-stage breast cancer or DCIS. Risk of lumpectomy include bleeding, infection, seroma, more surgery, use of seed/wire, wound care, cosmetic deformity and the need for other treatments, death , blood clots, death. Pt agrees to proceed.  Current Plans You are being scheduled for surgery- Our schedulers will call you.  You should hear from our office's scheduling department within 5 working days about the location, date, and time of surgery. We try to make  accommodations for patient's preferences in scheduling surgery, but sometimes the OR schedule or the surgeon's schedule prevents [place] from  making those accommodations.  If you have not heard from our office [phone]) in 5 working days, call the office and ask for your surgeon's nurse.  If you have other questions about your diagnosis, plan, or surgery, call the office and ask for your surgeon's nurse.  Pt Education - [facility] Breast Biopsy HCI: discussed with patient and provided information. Pt Education - [facility] Breast Pains Education

## 2018-08-18 NOTE — H&P (Signed)
MARKITA STCHARLES Documented: 08/18/2018 1:49 PM Location: [facility] Patient #: [id number] DOB: 04-04-1946 Single / Language: Cleophus Molt / Race: Black or African American Female  History of Present Illness Marcello Moores A. Aleathea Pugmire MD; 08/18/2018 3:56 PM) Patient words: Patient said at the request of Dr. Dorise Bullion for normal mammogram. She was noted to have a density left breast upper quadrant. Compression views and core biopsy revealed papilloma. The patient denies any history of breast pain, nipple discharge, breast mass, for family history of breast problems in general.                               CLINICAL DATA: Screening. EXAM: DIGITAL SCREENING BILATERAL MAMMOGRAM WITH TOMO AND CAD COMPARISON: Previous exam(s). ACR Breast Density Category a: The breast tissue is almost entirely fatty. FINDINGS: In the left breast, a possible mass warrants further evaluation. In the right breast, no findings suspicious for malignancy. Images were processed with CAD. IMPRESSION: Further evaluation is suggested for possible mass in the left breast. RECOMMENDATION: Diagnostic mammogram and possibly ultrasound of the left breast. (Code:FI-L-73M) The patient will be contacted regarding the findings, and additional imaging will be scheduled. BI-RADS CATEGORY 0: Incomplete. Need additional imaging evaluation and/or prior mammograms for comparison. Electronically Signed By: Lajean Manes M.D. On: 07/23/2018 15:43  Result History  MM 3D SCREEN BREAST BILATERAL (Order #[id number]) on 07/23/2018 - Order Result History Report <epic://[email or web address]>  US BREAST LTD UNI LEFT INC AXILLA (Order [id number]) - Reflex for Order [id number] Study Result  CLINICAL DATA: Screening recall for a left mass. EXAM: DIGITAL DIAGNOSTIC UNILATERAL LEFT MAMMOGRAM WITH CAD AND TOMO LEFT BREAST ULTRASOUND COMPARISON: Previous exam(s). ACR Breast Density Category a:  The breast tissue is almost entirely fatty. FINDINGS: There is a persistent 4 mm oval circumscribed mass in the lower inner posterior depth of the left breast. Mammographic images were processed with CAD. Ultrasound of the left breast at 8:30, 6 cm from the nipple demonstrates a mixed cystic and solid mass measuring 5 x 3 x 5 mm. The solid portion of the mass may represent adherent mural debris versus a soft tissue growth. IMPRESSION: There is an indeterminate mass in the left breast at 8:30. RECOMMENDATION: Ultrasound-guided biopsy is recommended for the left breast mass. This has been scheduled for 07/31/2018 at 3:45 p.m. I have discussed the findings and recommendations with the patient. Results were also provided in writing at the conclusion of the visit. If applicable, a reminder letter will be sent to the patient regarding the next appointment. BI-RADS CATEGORY 4: Suspicious. Electronically Signed By: Ammie Ferrier M.D. On: 07/30/2018 13:20  Result History  US BREAST LTD UNI LEFT INC AXILLA (Order #[id number]) on 07/30/2018 - Order Result History Report <epic://OPTION/?[email or web address]>  MM DIAG BREAST TOMO UNI LEFT (Order [id number]) - Reflex for Order [id number] Study Result  CLINICAL DATA: Screening recall for a left mass. EXAM: DIGITAL DIAGNOSTIC UNILATERAL LEFT MAMMOGRAM WITH CAD AND TOMO LEFT BREAST ULTRASOUND COMPARISON: Previous exam(s). ACR Breast Density Category a: The breast tissue is almost entirely fatty. FINDINGS: There is a persistent 4 mm oval circumscribed mass in the lower inner posterior depth of the left breast. Mammographic images were processed with CAD. Ultrasound of the left breast at 8:30, 6 cm from the nipple demonstrates a mixed cystic and solid mass measuring 5 x 3 x 5 mm. The solid portion of the mass may represent adherent mural debris versus a soft tissue growth. IMPRESSION: There is  an indeterminate mass in the left breast at 8:30.  RECOMMENDATION: Ultrasound-guided biopsy is recommended for the left breast mass. This has been scheduled for 07/31/2018 at 3:45 p.m. I have discussed the findings and recommendations with the patient. Results were also provided in writing at the conclusion of the visit. If applicable, a reminder letter will be sent to the patient regarding the next appointment. BI-RADS CATEGORY 4: Suspicious. Electronically Signed By: Ammie Ferrier M.D. On: 07/30/2018 13:20                  Diagnosis Breast, left, needle core biopsy, 8:30 o'clock - INTRADUCTAL PAPILLOMA WITH APOCRINE CHANGE. SEE NOTE. - NEGATIVE FOR CARCINOMA.  The patient is a 72 year old female.   Allergies (Hadelyn Massenburg, LPN; 2/42/3536 1:44 PM) No Known Drug Allergies [01/19/2015]: Allergies Reconciled  Medication History (Hadelyn Massenburg, LPN; 03/15/4007 6:76 PM) Amoxicillin-Pot Clavulanate (875-125MG  Tablet, Oral two times daily) Active. Lisinopril (40MG  Tablet, Oral daily) Active. MetFORMIN HCl ER (OSM) (1000MG  Tablet ER 24HR, Oral daily) Active. GlyBURIDE (5MG  Tablet, Oral daily) Active. Hydrochlorothiazide (25MG  Tablet, Oral daily) Active. Lovastatin (40MG  Tablet, Oral daily) Active. Medications Reconciled    Vitals (Hadelyn Massenburg LPN; 1/95/0932 6:71 PM) 08/18/2018 1:49 PM Weight: 190.38 lb Height: 65.5in Body Surface Area: 1.95 m Body Mass Index: 31.2 kg/m  Temp.: 98.65F(Oral)  Pulse: 75 (Regular)  Resp.: 18 (Unlabored)  P.OX: 98% (Room air) BP: 128/82 (Sitting, Left Arm, Standard)      Physical Exam (Winnell Bento A. Meili Kleckley MD; 08/18/2018 3:59 PM)  General Mental Status-Alert. General Appearance-Consistent with stated age. Hydration-Well hydrated. Voice-Normal.  Head and Neck Head-normocephalic, atraumatic with no lesions or palpable masses. Trachea-midline. Thyroid Gland Characteristics - normal size and  consistency.  Eye Eyeball - Bilateral-Extraocular movements intact. Sclera/Conjunctiva - Bilateral-No scleral icterus.  Chest and Lung Exam Chest and lung exam reveals -quiet, even and easy respiratory effort with no use of accessory muscles and on auscultation, normal breath sounds, no adventitious sounds and normal vocal resonance. Inspection Chest Wall - Normal. Back - normal.  Breast Breast - Left-Symmetric, Non Tender, No Biopsy scars, no Dimpling, No Inflammation, No Lumpectomy scars, No Mastectomy scars, No Peau d' Orange. Breast - Right-Symmetric, Non Tender, No Biopsy scars, no Dimpling, No Inflammation, No Lumpectomy scars, No Mastectomy scars, No Peau d' Orange. Breast Lump-No Palpable Breast Mass.  Cardiovascular Cardiovascular examination reveals -normal heart sounds, regular rate and rhythm with no murmurs and normal pedal pulses bilaterally.  Musculoskeletal Normal Exam - Left-Upper Extremity Strength Normal and Lower Extremity Strength Normal. Normal Exam - Right-Upper Extremity Strength Normal and Lower Extremity Strength Normal.  Lymphatic Head & Neck  General Head & Neck Lymphatics: Bilateral - Description - Normal. Axillary  General Axillary Region: Bilateral - Description - Normal. Tenderness - Non Tender.    Assessment & Plan (Graeme Menees A. Caylie Sandquist MD; 08/18/2018 3:59 PM)  PAPILLOMA OF LEFT BREAST (D24.2) Impression: Recommend left breast lumpectomy to exclude potential malignancy / early-stage breast cancer or DCIS. Risk of lumpectomy include bleeding, infection, seroma, more surgery, use of seed/wire, wound care, cosmetic deformity and the need for other treatments, death , blood clots, death. Pt agrees to proceed.  Current Plans You are being scheduled for surgery- Our schedulers will call you.  You should hear from our office's scheduling department within 5 working days about the location, date, and time of surgery. We try to make  accommodations for patient's preferences in scheduling surgery, but sometimes the OR schedule or the surgeon's schedule prevents [place] from  making those accommodations.  If you have not heard from our office [phone]) in 5 working days, call the office and ask for your surgeon's nurse.  If you have other questions about your diagnosis, plan, or surgery, call the office and ask for your surgeon's nurse.  Pt Education - [facility] Breast Biopsy HCI: discussed with patient and provided information. Pt Education - [facility] Breast Pains Education

## 2018-08-19 ENCOUNTER — Other Ambulatory Visit: Payer: Self-pay | Admitting: Surgery

## 2018-08-19 DIAGNOSIS — D242 Benign neoplasm of left breast: Secondary | ICD-10-CM

## 2018-08-20 ENCOUNTER — Other Ambulatory Visit: Payer: Self-pay

## 2018-08-20 ENCOUNTER — Encounter (HOSPITAL_BASED_OUTPATIENT_CLINIC_OR_DEPARTMENT_OTHER): Payer: Self-pay | Admitting: *Deleted

## 2018-08-20 NOTE — Progress Notes (Signed)
Pt plans to come tomorrow before 12 noon for BMET,CBC,EKG and to pick up Ensure. Bring all medications.

## 2018-08-21 ENCOUNTER — Encounter (HOSPITAL_BASED_OUTPATIENT_CLINIC_OR_DEPARTMENT_OTHER)
Admission: RE | Admit: 2018-08-21 | Discharge: 2018-08-21 | Disposition: A | Discharge status: Home / Self Care | Payer: Medicare HMO | Source: Ambulatory Visit | Attending: Surgery | Admitting: Surgery

## 2018-08-21 DIAGNOSIS — D242 Benign neoplasm of left breast: Secondary | ICD-10-CM | POA: Diagnosis not present

## 2018-08-21 DIAGNOSIS — I10 Essential (primary) hypertension: Secondary | ICD-10-CM | POA: Diagnosis not present

## 2018-08-21 DIAGNOSIS — Z87891 Personal history of nicotine dependence: Secondary | ICD-10-CM | POA: Diagnosis not present

## 2018-08-21 DIAGNOSIS — E119 Type 2 diabetes mellitus without complications: Secondary | ICD-10-CM | POA: Diagnosis not present

## 2018-08-21 DIAGNOSIS — E669 Obesity, unspecified: Secondary | ICD-10-CM | POA: Diagnosis not present

## 2018-08-21 DIAGNOSIS — Z79899 Other long term (current) drug therapy: Secondary | ICD-10-CM | POA: Diagnosis not present

## 2018-08-21 DIAGNOSIS — Z6831 Body mass index (BMI) 31.0-31.9, adult: Secondary | ICD-10-CM | POA: Diagnosis not present

## 2018-08-21 DIAGNOSIS — Z7984 Long term (current) use of oral hypoglycemic drugs: Secondary | ICD-10-CM | POA: Diagnosis not present

## 2018-08-21 LAB — CBC
HCT: 39.1 % (ref 36.0–46.0)
HEMOGLOBIN: 12 g/dL (ref 12.0–15.0)
MCH: 29.3 pg (ref 26.0–34.0)
MCHC: 30.7 g/dL (ref 30.0–36.0)
MCV: 95.4 fL (ref 78.0–100.0)
Platelets: 345 10*3/uL (ref 150–400)
RBC: 4.1 MIL/uL (ref 3.87–5.11)
RDW: 12.5 % (ref 11.5–15.5)
WBC: 8.1 10*3/uL (ref 4.0–10.5)

## 2018-08-21 LAB — BASIC METABOLIC PANEL
ANION GAP: 6 (ref 5–15)
BUN: 21 mg/dL (ref 8–23)
CALCIUM: 9.5 mg/dL (ref 8.9–10.3)
CO2: 31 mmol/L (ref 22–32)
CREATININE: 1.07 mg/dL — AB (ref 0.44–1.00)
Chloride: 105 mmol/L (ref 98–111)
GFR calc Af Amer: 59 mL/min — ABNORMAL LOW (ref >60)
GFR, EST NON AFRICAN AMERICAN: 51 mL/min — AB (ref >60)
GLUCOSE: 204 mg/dL — AB (ref 70–99)
Potassium: 4.9 mmol/L (ref 3.5–5.1)
Sodium: 142 mmol/L (ref 135–145)

## 2018-08-21 NOTE — Progress Notes (Signed)
EKG reviewed by Dr. Turk, will proceed with surgery as scheduled. 

## 2018-08-21 NOTE — Progress Notes (Signed)
Ensure Pre Surgery drink given to patient with instructions to complete by 0800 DOS. Patient verbalized understanding.

## 2018-08-22 ENCOUNTER — Ambulatory Visit
Admission: RE | Admit: 2018-08-22 | Discharge: 2018-08-22 | Disposition: A | Discharge status: Home / Self Care | Payer: Medicare HMO | Source: Ambulatory Visit | Attending: Surgery | Admitting: Surgery

## 2018-08-22 DIAGNOSIS — D242 Benign neoplasm of left breast: Secondary | ICD-10-CM

## 2018-08-22 DIAGNOSIS — D241 Benign neoplasm of right breast: Secondary | ICD-10-CM | POA: Diagnosis not present

## 2018-08-26 ENCOUNTER — Encounter (HOSPITAL_BASED_OUTPATIENT_CLINIC_OR_DEPARTMENT_OTHER): Payer: Self-pay | Admitting: *Deleted

## 2018-08-26 ENCOUNTER — Ambulatory Visit
Admission: RE | Admit: 2018-08-26 | Discharge: 2018-08-26 | Disposition: A | Discharge status: Home / Self Care | Payer: Medicare HMO | Source: Ambulatory Visit | Attending: Surgery | Admitting: Surgery

## 2018-08-26 ENCOUNTER — Ambulatory Visit (HOSPITAL_BASED_OUTPATIENT_CLINIC_OR_DEPARTMENT_OTHER): Payer: Medicare HMO | Admitting: Anesthesiology

## 2018-08-26 ENCOUNTER — Other Ambulatory Visit: Payer: Self-pay

## 2018-08-26 ENCOUNTER — Ambulatory Visit (HOSPITAL_BASED_OUTPATIENT_CLINIC_OR_DEPARTMENT_OTHER)
Admission: RE | Admit: 2018-08-26 | Discharge: 2018-08-26 | Disposition: A | Discharge status: Home / Self Care | Payer: Medicare HMO | Source: Ambulatory Visit | Attending: Surgery | Admitting: Surgery

## 2018-08-26 ENCOUNTER — Encounter (HOSPITAL_BASED_OUTPATIENT_CLINIC_OR_DEPARTMENT_OTHER)
Admission: RE | Disposition: A | Discharge status: Home / Self Care | Payer: Self-pay | Source: Ambulatory Visit | Attending: Surgery

## 2018-08-26 DIAGNOSIS — I10 Essential (primary) hypertension: Secondary | ICD-10-CM | POA: Diagnosis not present

## 2018-08-26 DIAGNOSIS — D242 Benign neoplasm of left breast: Secondary | ICD-10-CM | POA: Insufficient documentation

## 2018-08-26 DIAGNOSIS — Z87891 Personal history of nicotine dependence: Secondary | ICD-10-CM | POA: Diagnosis not present

## 2018-08-26 DIAGNOSIS — Z79899 Other long term (current) drug therapy: Secondary | ICD-10-CM | POA: Diagnosis not present

## 2018-08-26 DIAGNOSIS — Z6831 Body mass index (BMI) 31.0-31.9, adult: Secondary | ICD-10-CM | POA: Insufficient documentation

## 2018-08-26 DIAGNOSIS — E119 Type 2 diabetes mellitus without complications: Secondary | ICD-10-CM | POA: Diagnosis not present

## 2018-08-26 DIAGNOSIS — E669 Obesity, unspecified: Secondary | ICD-10-CM | POA: Insufficient documentation

## 2018-08-26 DIAGNOSIS — Z7984 Long term (current) use of oral hypoglycemic drugs: Secondary | ICD-10-CM | POA: Insufficient documentation

## 2018-08-26 HISTORY — DX: Anemia, unspecified: D64.9

## 2018-08-26 HISTORY — PX: BREAST LUMPECTOMY WITH RADIOACTIVE SEED LOCALIZATION: SHX6424

## 2018-08-26 LAB — GLUCOSE, CAPILLARY
Glucose-Capillary: 203 mg/dL — ABNORMAL HIGH (ref 70–99)
Glucose-Capillary: 153 mg/dL — ABNORMAL HIGH (ref 70–99)

## 2018-08-26 SURGERY — BREAST LUMPECTOMY WITH RADIOACTIVE SEED LOCALIZATION
Anesthesia: General | Site: Breast | Laterality: Left

## 2018-08-26 MED ORDER — FENTANYL CITRATE (PF) 100 MCG/2ML IJ SOLN
50.0000 ug | INTRAMUSCULAR | Status: DC | PRN
  Administered 2018-08-26: 50 ug via INTRAVENOUS

## 2018-08-26 MED ORDER — GABAPENTIN 300 MG PO CAPS
ORAL_CAPSULE | ORAL | Status: AC
  Filled 2018-08-26: qty 1

## 2018-08-26 MED ORDER — CELECOXIB 400 MG PO CAPS
400.0000 mg | ORAL_CAPSULE | ORAL | Status: AC
  Administered 2018-08-26: 200 mg via ORAL

## 2018-08-26 MED ORDER — FENTANYL CITRATE (PF) 100 MCG/2ML IJ SOLN: 25.0000 ug | INTRAMUSCULAR | Status: DC | PRN

## 2018-08-26 MED ORDER — PROPOFOL 10 MG/ML IV BOLUS
INTRAVENOUS | Status: DC | PRN
  Administered 2018-08-26 (×2): 100 mg via INTRAVENOUS

## 2018-08-26 MED ORDER — LACTATED RINGERS IV SOLN
INTRAVENOUS | Status: DC
  Administered 2018-08-26: 11:00:00 via INTRAVENOUS

## 2018-08-26 MED ORDER — CHLORHEXIDINE GLUCONATE CLOTH 2 % EX PADS: 6.0000 | MEDICATED_PAD | Freq: Once | CUTANEOUS | Status: DC

## 2018-08-26 MED ORDER — GABAPENTIN 300 MG PO CAPS
300.0000 mg | ORAL_CAPSULE | ORAL | Status: AC
  Administered 2018-08-26: 300 mg via ORAL

## 2018-08-26 MED ORDER — CEFAZOLIN SODIUM-DEXTROSE 2-4 GM/100ML-% IV SOLN
INTRAVENOUS | Status: AC
  Filled 2018-08-26: qty 100

## 2018-08-26 MED ORDER — ACETAMINOPHEN 500 MG PO TABS
ORAL_TABLET | ORAL | Status: AC
  Filled 2018-08-26: qty 2

## 2018-08-26 MED ORDER — FENTANYL CITRATE (PF) 100 MCG/2ML IJ SOLN
INTRAMUSCULAR | Status: AC
  Filled 2018-08-26: qty 2

## 2018-08-26 MED ORDER — ONDANSETRON HCL 4 MG/2ML IJ SOLN: 4.0000 mg | Freq: Once | INTRAMUSCULAR | Status: DC | PRN

## 2018-08-26 MED ORDER — BUPIVACAINE-EPINEPHRINE 0.5% -1:200000 IJ SOLN
INTRAMUSCULAR | Status: DC | PRN
  Administered 2018-08-26: 25 mL

## 2018-08-26 MED ORDER — ONDANSETRON HCL 4 MG/2ML IJ SOLN
INTRAMUSCULAR | Status: DC | PRN
  Administered 2018-08-26: 4 mg via INTRAVENOUS

## 2018-08-26 MED ORDER — CELECOXIB 200 MG PO CAPS
ORAL_CAPSULE | ORAL | Status: AC
  Filled 2018-08-26: qty 2

## 2018-08-26 MED ORDER — SCOPOLAMINE 1 MG/3DAYS TD PT72: 1.0000 | MEDICATED_PATCH | Freq: Once | TRANSDERMAL | Status: DC | PRN

## 2018-08-26 MED ORDER — DEXAMETHASONE SODIUM PHOSPHATE 4 MG/ML IJ SOLN
INTRAMUSCULAR | Status: DC | PRN
  Administered 2018-08-26: 10 mg via INTRAVENOUS

## 2018-08-26 MED ORDER — DEXTROSE 5 % IV SOLN
3.0000 g | INTRAVENOUS | Status: AC
  Administered 2018-08-26: 2 g via INTRAVENOUS

## 2018-08-26 MED ORDER — ACETAMINOPHEN 500 MG PO TABS
1000.0000 mg | ORAL_TABLET | ORAL | Status: AC
  Administered 2018-08-26: 1000 mg via ORAL

## 2018-08-26 MED ORDER — HYDROCODONE-ACETAMINOPHEN 5-325 MG PO TABS: 1.0000 | ORAL_TABLET | Freq: Four times a day (QID) | ORAL | 0 refills | Status: DC | PRN

## 2018-08-26 MED ORDER — IBUPROFEN 800 MG PO TABS: 800.0000 mg | ORAL_TABLET | Freq: Three times a day (TID) | ORAL | 0 refills | Status: DC | PRN

## 2018-08-26 MED ORDER — MIDAZOLAM HCL 2 MG/2ML IJ SOLN: 1.0000 mg | INTRAMUSCULAR | Status: DC | PRN

## 2018-08-26 MED ORDER — EPHEDRINE SULFATE-NACL 50-0.9 MG/10ML-% IV SOSY
PREFILLED_SYRINGE | INTRAVENOUS | Status: DC | PRN
  Administered 2018-08-26: 10 mg via INTRAVENOUS

## 2018-08-26 MED ORDER — LIDOCAINE 2% (20 MG/ML) 5 ML SYRINGE
INTRAMUSCULAR | Status: DC | PRN
  Administered 2018-08-26: 80 mg via INTRAVENOUS

## 2018-08-26 SURGICAL SUPPLY — 39 items
ADH SKN CLS APL DERMABOND .7 (GAUZE/BANDAGES/DRESSINGS) ×1
BINDER BREAST XXLRG (GAUZE/BANDAGES/DRESSINGS) ×2 IMPLANT
BLADE SURG 15 STRL LF DISP TIS (BLADE) ×1 IMPLANT
BLADE SURG 15 STRL SS (BLADE) ×3
CHLORAPREP W/TINT 26ML (MISCELLANEOUS) ×3 IMPLANT
COVER BACK TABLE 60X90IN (DRAPES) ×3 IMPLANT
COVER MAYO STAND STRL (DRAPES) ×3 IMPLANT
COVER PROBE W GEL 5X96 (DRAPES) ×3 IMPLANT
DERMABOND ADVANCED (GAUZE/BANDAGES/DRESSINGS) ×2
DERMABOND ADVANCED .7 DNX12 (GAUZE/BANDAGES/DRESSINGS) ×1 IMPLANT
DEVICE DUBIN W/COMP PLATE 8390 (MISCELLANEOUS) ×3 IMPLANT
DRAPE LAPAROSCOPIC ABDOMINAL (DRAPES) ×2 IMPLANT
DRAPE UTILITY XL STRL (DRAPES) ×3 IMPLANT
ELECT COATED BLADE 2.86 ST (ELECTRODE) ×3 IMPLANT
ELECT REM PT RETURN 9FT ADLT (ELECTROSURGICAL) ×3
ELECTRODE REM PT RTRN 9FT ADLT (ELECTROSURGICAL) ×1 IMPLANT
GLOVE BIO SURGEON STRL SZ 6.5 (GLOVE) ×1 IMPLANT
GLOVE BIO SURGEONS STRL SZ 6.5 (GLOVE) ×1
GLOVE BIOGEL PI IND STRL 7.0 (GLOVE) IMPLANT
GLOVE BIOGEL PI IND STRL 8 (GLOVE) ×1 IMPLANT
GLOVE BIOGEL PI INDICATOR 7.0 (GLOVE) ×2
GLOVE BIOGEL PI INDICATOR 8 (GLOVE) ×2
GLOVE ECLIPSE 8.0 STRL XLNG CF (GLOVE) ×3 IMPLANT
GOWN STRL REUS W/ TWL LRG LVL3 (GOWN DISPOSABLE) ×2 IMPLANT
GOWN STRL REUS W/TWL LRG LVL3 (GOWN DISPOSABLE) ×6
KIT MARKER MARGIN INK (KITS) ×3 IMPLANT
NDL HYPO 25X1 1.5 SAFETY (NEEDLE) ×1 IMPLANT
NEEDLE HYPO 25X1 1.5 SAFETY (NEEDLE) ×3 IMPLANT
NS IRRIG 1000ML POUR BTL (IV SOLUTION) ×3 IMPLANT
PACK BASIN DAY SURGERY FS (CUSTOM PROCEDURE TRAY) ×3 IMPLANT
PENCIL BUTTON HOLSTER BLD 10FT (ELECTRODE) ×3 IMPLANT
SLEEVE SCD COMPRESS KNEE MED (MISCELLANEOUS) ×3 IMPLANT
SPONGE LAP 4X18 RFD (DISPOSABLE) ×3 IMPLANT
SUT MNCRL AB 4-0 PS2 18 (SUTURE) ×3 IMPLANT
SUT VICRYL 3-0 CR8 SH (SUTURE) ×3 IMPLANT
SYR CONTROL 10ML LL (SYRINGE) ×3 IMPLANT
TOWEL GREEN STERILE FF (TOWEL DISPOSABLE) ×3 IMPLANT
TOWEL OR NON WOVEN STRL DISP B (DISPOSABLE) ×3 IMPLANT
YANKAUER SUCT BULB TIP NO VENT (SUCTIONS) IMPLANT

## 2018-08-26 NOTE — Op Note (Signed)
Preoperative diagnosis: Left breast papilloma  Postoperative diagnosis: Same  Procedure: Left breast seed localized lumpectomy  Surgeon: Erroll Luna, MD  Anesthesia: LMA with local  EBL: 10 cc  IV fluids: Per anesthesia record  Specimen: Left breast tissue with seed and clip verified by Faxitron  Drains: None  Indications for procedure: The patient presents for left breast lumpectomy secondary to a screen detected mammographic abnormality.  Core biopsy showed papilloma.  We discussed the pros and cons of excision as well as potential risk of upgrade to a more malignant lesion of up to 20% in the literature.  After discussion of observation versus lumpectomy she opted for lumpectomy.The procedure has been discussed with the patient. Alternatives to surgery have been discussed with the patient.  Risks of surgery include bleeding,  Infection,  Seroma formation, death,  and the need for further surgery.   The patient understands and wishes to proceed.     Description of procedure: The patient underwent seed localization as an outpatient.  She was seen in the holding area.  The neoprobe was used to check seed activity was present and active.  The left side was marked as correct.  Questions were answered.  She was taken back to the operative room and placed supine upon the OR table.  After induction of general anesthesia, left breast was prepped and draped in sterile fashion and timeout was done.  The neoprobe was used and the seed was identified in the left medial breast.  Films were available for review which confirmed this.  Transverse incision was made over the signal.  Dissection was carried down and all tissue around the seed and clip were excised with a grossly negative margin.  Faxitron image revealed both seed and clip to be in the center of the specimen.  The cavity was inspected made to be hemostatic.  Local anesthetic was infiltrated.  The incisions were closed with 3-0 Vicryl and 4-0  Monocryl.  Dermabond applied.  All final counts found to be correct.  The patient was awoke extubated taken to recovery in satisfactory condition.

## 2018-08-26 NOTE — Discharge Instructions (Signed)
[facility] °Office Phone Number [phone] ° °BREAST BIOPSY/ PARTIAL MASTECTOMY: POST OP INSTRUCTIONS ° °Always review your discharge instruction sheet given to you by the facility where your surgery was performed. ° °IF YOU HAVE DISABILITY OR FAMILY LEAVE FORMS, YOU MUST BRING THEM TO THE OFFICE FOR PROCESSING.  DO NOT GIVE THEM TO YOUR DOCTOR. ° °1. A prescription for pain medication may be given to you upon discharge.  Take your pain medication as prescribed, if needed.  If narcotic pain medicine is not needed, then you may take acetaminophen (Tylenol) or ibuprofen (Advil) as needed. °2. Take your usually prescribed medications unless otherwise directed °3. If you need a refill on your pain medication, please contact your pharmacy.  They will contact our office to request authorization.  Prescriptions will not be filled after 5pm or on week-ends. °4. You should eat very light the first 24 hours after surgery, such as soup, crackers, pudding, etc.  Resume your normal diet the day after surgery. °5. Most patients will experience some swelling and bruising in the breast.  Ice packs and a good support bra will help.  Swelling and bruising can take several days to resolve.  °6. It is common to experience some constipation if taking pain medication after surgery.  Increasing fluid intake and taking a stool softener will usually help or prevent this problem from occurring.  A mild laxative (Milk of Magnesia or Miralax) should be taken according to package directions if there are no bowel movements after 48 hours. °7. Unless discharge instructions indicate otherwise, you may remove your bandages 24-48 hours after surgery, and you may shower at that time.  You may have steri-strips (small skin tapes) in place directly over the incision.  These strips should be left on the skin for 7-10 days.  If your surgeon used skin glue on the incision, you may shower in 24 hours.  The glue will flake off over the  next 2-3 weeks.  Any sutures or staples will be removed at the office during your follow-up visit. °8. ACTIVITIES:  You may resume regular daily activities (gradually increasing) beginning the next day.  Wearing a good support bra or sports bra minimizes pain and swelling.  You may have sexual intercourse when it is comfortable. °a. You may drive when you no longer are taking prescription pain medication, you can comfortably wear a seatbelt, and you can safely maneuver your car and apply brakes. °b. RETURN TO WORK:  ______________________________________________________________________________________ °9. You should see your doctor in the office for a follow-up appointment approximately two weeks after your surgery.  Your doctor’s nurse will typically make your follow-up appointment when she calls you with your pathology report.  Expect your pathology report 2-3 business days after your surgery.  You may call to check if you do not hear from us after three days. °10. OTHER INSTRUCTIONS: _______________________________________________________________________________________________ _____________________________________________________________________________________________________________________________________ °_____________________________________________________________________________________________________________________________________ °_____________________________________________________________________________________________________________________________________ ° °WHEN TO CALL YOUR DOCTOR: °1. Fever over 101.0 °2. Nausea and/or vomiting. °3. Extreme swelling or bruising. °4. Continued bleeding from incision. °5. Increased pain, redness, or drainage from the incision. ° °The clinic staff is available to answer your questions during regular business hours.  Please don’t hesitate to call and ask to speak to one of the nurses for clinical concerns.  If you have a medical emergency, go to the nearest  emergency room or call 911.  A surgeon from [facility] is always on call at the hospital. ° °For further questions, please visit [email or web address]  ° ° ° ° °  Post Anesthesia Home Care Instructions ° °Activity: °Get plenty of rest for the remainder of the day. A responsible individual must stay with you for 24 hours following the procedure.  °For the next 24 hours, DO NOT: °-Drive a car °-Operate machinery °-Drink alcoholic beverages °-Take any medication unless instructed by your physician °-Make any legal decisions or sign important papers. ° °Meals: °Start with liquid foods such as gelatin or soup. Progress to regular foods as tolerated. Avoid greasy, spicy, heavy foods. If nausea and/or vomiting occur, drink only clear liquids until the nausea and/or vomiting subsides. Call your physician if vomiting continues. ° °Special Instructions/Symptoms: °Your throat may feel dry or sore from the anesthesia or the breathing tube placed in your throat during surgery. If this causes discomfort, gargle with warm salt water. The discomfort should disappear within 24 hours. ° °If you had a scopolamine patch placed behind your ear for the management of post- operative nausea and/or vomiting: ° °1. The medication in the patch is effective for 72 hours, after which it should be removed.  Wrap patch in a tissue and discard in the trash. Wash hands thoroughly with soap and water. °2. You may remove the patch earlier than 72 hours if you experience unpleasant side effects which may include dry mouth, dizziness or visual disturbances. °3. Avoid touching the patch. Wash your hands with soap and water after contact with the patch. °  ° °

## 2018-08-26 NOTE — Anesthesia Postprocedure Evaluation (Signed)
Anesthesia Post Note  Patient: Jessica Jacobs  Procedure(s) Performed: LEFT BREAST LUMPECTOMY WITH RADIOACTIVE SEED LOCALIZATION (Left Breast)     Patient location during evaluation: PACU Anesthesia Type: General Level of consciousness: awake and alert Pain management: pain level controlled Vital Signs Assessment: post-procedure vital signs reviewed and stable Respiratory status: spontaneous breathing, nonlabored ventilation, respiratory function stable and patient connected to nasal cannula oxygen Cardiovascular status: blood pressure returned to baseline and stable Postop Assessment: no apparent nausea or vomiting Anesthetic complications: no    Last Vitals:  Vitals:   08/26/18 1315 08/26/18 1335  BP:  137/77  Pulse:  63  Resp:  18  Temp:  36.5 C  SpO2: 95% 94%    Last Pain:  Vitals:   08/26/18 1335  TempSrc:   PainSc: 0-No pain                 Trayon Krantz P Shirlene Andaya

## 2018-08-26 NOTE — Anesthesia Procedure Notes (Signed)
Procedure Name: LMA Insertion Date/Time: 08/26/2018 12:11 PM Performed by: Lyndee Leo, CRNA Pre-anesthesia Checklist: Patient identified, Emergency Drugs available, Suction available and Patient being monitored Patient Re-evaluated:Patient Re-evaluated prior to induction Oxygen Delivery Method: Circle system utilized Preoxygenation: Pre-oxygenation with 100% oxygen Induction Type: IV induction Ventilation: Mask ventilation without difficulty LMA: LMA inserted LMA Size: 4.0 Number of attempts: 1 Airway Equipment and Method: Bite block Placement Confirmation: positive ETCO2 Tube secured with: Tape Dental Injury: Teeth and Oropharynx as per pre-operative assessment

## 2018-08-26 NOTE — Interval H&P Note (Signed)
History and Physical Interval Note:  08/26/2018 10:53 AM  Jessica Jacobs  has presented today for surgery, with the diagnosis of papilloma  The various methods of treatment have been discussed with the patient and family. After consideration of risks, benefits and other options for treatment, the patient has consented to  Procedure(s): LEFT BREAST LUMPECTOMY WITH RADIOACTIVE SEED LOCALIZATION ERAS PATHWAY (Left) as a surgical intervention .  The patient's history has been reviewed, patient examined, no change in status, stable for surgery.  I have reviewed the patient's chart and labs.  Questions were answered to the patient's satisfaction.     [place]

## 2018-08-26 NOTE — Transfer of Care (Signed)
Immediate Anesthesia Transfer of Care Note  Patient: Jessica Jacobs  Procedure(s) Performed: LEFT BREAST LUMPECTOMY WITH RADIOACTIVE SEED LOCALIZATION (Left Breast)  Patient Location: PACU  Anesthesia Type:General  Level of Consciousness: awake and sedated  Airway & Oxygen Therapy: Patient Spontanous Breathing and Patient connected to face mask oxygen  Post-op Assessment: Report given to RN and Post -op Vital signs reviewed and stable  Post vital signs: Reviewed and stable  Last Vitals:  Vitals Value Taken Time  BP    Temp    Pulse 64 08/26/2018 12:48 PM  Resp 13 08/26/2018 12:48 PM  SpO2 100 % 08/26/2018 12:48 PM  Vitals shown include unvalidated device data.  Last Pain:  Vitals:   08/26/18 1033  TempSrc: Oral  PainSc: 0-No pain         Complications: No apparent anesthesia complications

## 2018-08-26 NOTE — Anesthesia Preprocedure Evaluation (Addendum)
Anesthesia Evaluation  Patient identified by MRN, date of birth, ID band Patient awake    Reviewed: Allergy & Precautions, NPO status , Patient's Chart, lab work & pertinent test results, reviewed documented beta blocker date and time   Airway Mallampati: III  TM Distance: >3 FB Neck ROM: Full    Dental no notable dental hx.    Pulmonary former smoker,    Pulmonary exam normal breath sounds clear to auscultation       Cardiovascular hypertension, Pt. on medications and Pt. on home beta blockers Normal cardiovascular exam Rhythm:Regular Rate:Normal  ECG: rate 65, Normal sinus rhythm Left axis deviation   Neuro/Psych  Headaches, negative psych ROS   GI/Hepatic negative GI ROS, Neg liver ROS,   Endo/Other  diabetes, Oral Hypoglycemic Agents  Renal/GU negative Renal ROS     Musculoskeletal negative musculoskeletal ROS (+)   Abdominal (+) + obese,   Peds  Hematology HLD   Anesthesia Other Findings papilloma  Reproductive/Obstetrics                            Anesthesia Physical Anesthesia Plan  ASA: II  Anesthesia Plan: General   Post-op Pain Management:    Induction: Intravenous  PONV Risk Score and Plan: 3 and Ondansetron, Dexamethasone and Treatment may vary due to age or medical condition  Airway Management Planned: LMA  Additional Equipment:   Intra-op Plan:   Post-operative Plan: Extubation in OR  Informed Consent: I have reviewed the patients History and Physical, chart, labs and discussed the procedure including the risks, benefits and alternatives for the proposed anesthesia with the patient or authorized representative who has indicated his/her understanding and acceptance.   Dental advisory given  Plan Discussed with: CRNA  Anesthesia Plan Comments:         Anesthesia Quick Evaluation

## 2018-08-27 ENCOUNTER — Encounter (HOSPITAL_BASED_OUTPATIENT_CLINIC_OR_DEPARTMENT_OTHER): Payer: Self-pay | Admitting: Surgery

## 2018-10-14 DIAGNOSIS — I739 Peripheral vascular disease, unspecified: Secondary | ICD-10-CM | POA: Diagnosis not present

## 2018-10-14 DIAGNOSIS — L84 Corns and callosities: Secondary | ICD-10-CM | POA: Diagnosis not present

## 2018-10-14 DIAGNOSIS — L603 Nail dystrophy: Secondary | ICD-10-CM | POA: Diagnosis not present

## 2018-10-14 DIAGNOSIS — E1151 Type 2 diabetes mellitus with diabetic peripheral angiopathy without gangrene: Secondary | ICD-10-CM | POA: Diagnosis not present

## 2019-01-08 DIAGNOSIS — L603 Nail dystrophy: Secondary | ICD-10-CM | POA: Diagnosis not present

## 2019-01-08 DIAGNOSIS — L84 Corns and callosities: Secondary | ICD-10-CM | POA: Diagnosis not present

## 2019-01-08 DIAGNOSIS — I739 Peripheral vascular disease, unspecified: Secondary | ICD-10-CM | POA: Diagnosis not present

## 2019-01-08 DIAGNOSIS — E1151 Type 2 diabetes mellitus with diabetic peripheral angiopathy without gangrene: Secondary | ICD-10-CM | POA: Diagnosis not present

## 2019-01-10 ENCOUNTER — Encounter (HOSPITAL_COMMUNITY): Payer: Self-pay | Admitting: *Deleted

## 2019-01-10 ENCOUNTER — Ambulatory Visit (HOSPITAL_COMMUNITY)
Admission: EM | Admit: 2019-01-10 | Discharge: 2019-01-10 | Disposition: A | Discharge status: Home / Self Care | Payer: Medicare HMO | Attending: Family Medicine | Admitting: Family Medicine

## 2019-01-10 DIAGNOSIS — R739 Hyperglycemia, unspecified: Secondary | ICD-10-CM | POA: Diagnosis not present

## 2019-01-10 LAB — POCT I-STAT, CHEM 8
BUN: 20 mg/dL (ref 8–23)
CREATININE: 1.2 mg/dL — AB (ref 0.44–1.00)
Calcium, Ion: 1.21 mmol/L (ref 1.15–1.40)
Chloride: 99 mmol/L (ref 98–111)
Glucose, Bld: 224 mg/dL — ABNORMAL HIGH (ref 70–99)
HEMATOCRIT: 40 % (ref 36.0–46.0)
HEMOGLOBIN: 13.6 g/dL (ref 12.0–15.0)
Potassium: 4.6 mmol/L (ref 3.5–5.1)
SODIUM: 138 mmol/L (ref 135–145)
TCO2: 29 mmol/L (ref 22–32)

## 2019-01-10 LAB — POCT URINALYSIS DIP (DEVICE)
Bilirubin Urine: NEGATIVE
GLUCOSE, UA: 250 mg/dL — AB
Hgb urine dipstick: NEGATIVE
KETONES UR: NEGATIVE mg/dL
Leukocytes, UA: NEGATIVE
Nitrite: NEGATIVE
PROTEIN: NEGATIVE mg/dL
SPECIFIC GRAVITY, URINE: 1.025 (ref 1.005–1.030)
UROBILINOGEN UA: 0.2 mg/dL (ref 0.0–1.0)
pH: 5 (ref 5.0–8.0)

## 2019-01-10 NOTE — ED Provider Notes (Signed)
[facility]    CSN: [id number] Arrival date & time: 01/10/19  1432     History   Chief Complaint Chief Complaint  Patient presents with  . Hyperglycemia  . Weakness    HPI Jessica Jacobs is a 73 y.o. female.   This is an established patient who reports having CBGs in 300s every day this past week - today was 314 at home.  C/O weakness.  Has not been in touch with her PCP.  Patient not run a fever.  She has no urinary symptoms.  She has had some abdominal discomfort but this is passed and she is having regular bowel movements every day.  She has had no new cough or trouble with her teeth.  Patient is feeling a little dry in her mouth and a little weak and lightheaded when she stands up.     Past Medical History:  Diagnosis Date  . Anemia   . Diabetes mellitus without complication ([place])   . Hypertension     Patient Active Problem List   Diagnosis Date Noted  . OBESITY 02/22/2011  . PHARYNGITIS 12/21/2010  . GUAIAC POSITIVE STOOL 10/06/2010  . VAGINITIS, CANDIDAL 02/28/2010  . SKIN TAG 12/19/2009  . ONYCHOMYCOSIS 10/26/2009  . BACK PAIN, LUMBAR 12/07/2008  . URI 08/10/2008  . WEIGHT GAIN 06/15/2008  . CELLULITIS AND ABSCESS OF OTHER SPECIFIED SITE 01/26/2008  . SEBACEOUS CYST, INFECTED 01/26/2008  . HEADACHE 11/05/2007  . DIABETES MELLITUS, TYPE II 08/26/2007  . HYPERLIPIDEMIA 08/26/2007  . HYPERTENSION 08/26/2007  . DIVERTICULITIS, HX OF 08/26/2007  . COLECTOMY, PARTIAL, WITH ANASTOMOSIS, HX OF 08/26/2007    Past Surgical History:  Procedure Laterality Date  . ABDOMINAL HYSTERECTOMY    . BACK SURGERY    . BREAST LUMPECTOMY WITH RADIOACTIVE SEED LOCALIZATION Left 08/26/2018   Procedure: LEFT BREAST LUMPECTOMY WITH RADIOACTIVE SEED LOCALIZATION;  Surgeon: Erroll Luna, MD;  Location: [facility];  Service: General;  Laterality: Left;    OB History   No obstetric history on file.      Home Medications    Prior to  Admission medications   Medication Sig Start Date End Date Taking? Authorizing Provider  Cholecalciferol (VITAMIN D-3 PO) Take by mouth.   Yes [provider]  gabapentin (NEURONTIN) 300 MG capsule Take 300 mg by mouth 3 (three) times daily. Take 300mg  every morning and 600mg  every night   Yes [provider]  glimepiride (AMARYL) 4 MG tablet Take 4 mg by mouth 2 (two) times daily.   Yes [provider]  hydrochlorothiazide (HYDRODIURIL) 25 MG tablet Take 25 mg by mouth daily.   Yes [provider]  IRON PO Take by mouth.   Yes [provider]  lisinopril (PRINIVIL,ZESTRIL) 40 MG tablet TAKE ONE TABLET BY MOUTH EVERY DAY IN THE MORNING 08/16/11  Yes [name], Scott T, PA-C  lovastatin (MEVACOR) 40 MG tablet Take 40 mg by mouth at bedtime.   Yes [provider]  metFORMIN (GLUCOPHAGE) 1000 MG tablet TAKE ONE TABLET BY MOUTH TWICE DAILY 08/16/11  Yes [name], Scott T, PA-C  METOPROLOL TARTRATE PO Take by mouth.   Yes [provider]  sitaGLIPtin (JANUVIA) 100 MG tablet Take 100 mg by mouth daily.   Yes [provider]  vitamin B-12 (CYANOCOBALAMIN) 1000 MCG tablet Take 1,000 mcg by mouth daily.   Yes [provider]  HYDROcodone-acetaminophen (NORCO/VICODIN) 5-325 MG tablet Take 1 tablet by mouth every 6 (six) hours as needed for  moderate pain. 08/26/18   Cornett, Marcello Moores, MD  ibuprofen (ADVIL,MOTRIN) 800 MG tablet Take 1 tablet (800 mg total) by mouth every 8 (eight) hours as needed. 08/26/18   Erroll Luna, MD    Family History Family History  Problem Relation Age of Onset  . Cirrhosis Father   . Breast cancer Neg Hx     Social History Social History   Tobacco Use  . Smoking status: Former Research scientist (life sciences)  . Smokeless tobacco: Never Used  . Tobacco comment: quit 5 years ago  Substance Use Topics  . Alcohol use: No  . Drug use: Yes    Types: Marijuana     Allergies   Patient has no known allergies.   Review of  Systems Review of Systems   Physical Exam Triage Vital Signs ED Triage Vitals  Enc Vitals Group     BP 01/10/19 1544 96/60     Pulse Rate 01/10/19 1544 74     Resp 01/10/19 1544 18     Temp 01/10/19 1544 (!) 97.3 F (36.3 C)     Temp Source 01/10/19 1544 Oral     SpO2 01/10/19 1544 96 %     Weight --      Height --      Head Circumference --      Peak Flow --      Pain Score 01/10/19 1545 0     Pain Loc --      Pain Edu? --      Excl. in [place]? --    No data found.  Updated Vital Signs BP 96/60   Pulse 74   Temp (!) 97.3 F (36.3 C) (Oral)   Resp 18   SpO2 96%    Physical Exam Vitals signs and nursing note reviewed.  Constitutional:      Appearance: Normal appearance. She is obese.  HENT:     Head: Normocephalic and atraumatic.     Right Ear: External ear normal.     Left Ear: External ear normal.     Nose: Nose normal.     Mouth/Throat:     Mouth: Mucous membranes are moist.     Pharynx: Oropharynx is clear.  Eyes:     Conjunctiva/sclera: Conjunctivae normal.     Pupils: Pupils are equal, round, and reactive to light.  Neck:     Musculoskeletal: Normal range of motion.  Cardiovascular:     Rate and Rhythm: Normal rate and regular rhythm.     Heart sounds: Normal heart sounds.  Pulmonary:     Effort: Pulmonary effort is normal.     Breath sounds: Normal breath sounds.  Abdominal:     General: Bowel sounds are normal.     Palpations: There is no mass.     Tenderness: There is no abdominal tenderness.  Musculoskeletal: Normal range of motion.  Skin:    General: Skin is warm and dry.  Neurological:     General: No focal deficit present.     Mental Status: She is alert and oriented to person, place, and time.  Psychiatric:        Mood and Affect: Mood normal.        Behavior: Behavior normal.        Thought Content: Thought content normal.        Judgment: Judgment normal.      UC Treatments / Results  Labs (all labs ordered are listed, but only  abnormal results are displayed) Labs Reviewed  POCT URINALYSIS  DIP (DEVICE) - Abnormal; Notable for the following components:      Result Value   Glucose, UA 250 (*)    All other components within normal limits  POCT I-STAT, CHEM 8 - Abnormal; Notable for the following components:   Creatinine, Ser 1.20 (*)    Glucose, Bld 224 (*)    All other components within normal limits  I-STAT CHEM 8, ED    EKG None  Radiology No results found.  Procedures Procedures (including critical care time)  Medications Ordered in UC Medications - No data to display  Initial Impression / Assessment and Plan / UC Course  I have reviewed the triage vital signs and the nursing notes.  Pertinent labs & imaging results that were available during my care of the patient were reviewed by me and considered in my medical decision making (see chart for details).    Final Clinical Impressions(s) / UC Diagnoses   Final diagnoses:  Hyperglycemia     Discharge Instructions     Continue moving is much as you can several times a day.  You do not have a critically elevated blood sugar at this point.  Continue your regular medicines.    ED Prescriptions    None     Controlled Substance Prescriptions [place] Controlled Substance Registry consulted? Not Applicable   Robyn Haber, MD 01/10/19 1626

## 2019-01-10 NOTE — ED Triage Notes (Signed)
Reports having CBGs in 300s every day this past week - today was 314 at home.  C/O weakness.  Has not been in touch with her PCP.

## 2019-01-10 NOTE — Discharge Instructions (Addendum)
Continue moving is much as you can several times a day.  You do not have a critically elevated blood sugar at this point.  Continue your regular medicines.

## 2019-01-15 DIAGNOSIS — H52223 Regular astigmatism, bilateral: Secondary | ICD-10-CM | POA: Diagnosis not present

## 2019-01-15 DIAGNOSIS — H11153 Pinguecula, bilateral: Secondary | ICD-10-CM | POA: Diagnosis not present

## 2019-01-15 DIAGNOSIS — H5203 Hypermetropia, bilateral: Secondary | ICD-10-CM | POA: Diagnosis not present

## 2019-01-15 DIAGNOSIS — I10 Essential (primary) hypertension: Secondary | ICD-10-CM | POA: Diagnosis not present

## 2019-01-15 DIAGNOSIS — H401132 Primary open-angle glaucoma, bilateral, moderate stage: Secondary | ICD-10-CM | POA: Diagnosis not present

## 2019-01-15 DIAGNOSIS — E119 Type 2 diabetes mellitus without complications: Secondary | ICD-10-CM | POA: Diagnosis not present

## 2019-01-15 DIAGNOSIS — H524 Presbyopia: Secondary | ICD-10-CM | POA: Diagnosis not present

## 2019-01-15 DIAGNOSIS — Z7984 Long term (current) use of oral hypoglycemic drugs: Secondary | ICD-10-CM | POA: Diagnosis not present

## 2019-01-15 DIAGNOSIS — H18413 Arcus senilis, bilateral: Secondary | ICD-10-CM | POA: Diagnosis not present

## 2019-01-16 ENCOUNTER — Other Ambulatory Visit: Payer: Self-pay | Admitting: Internal Medicine

## 2019-01-16 ENCOUNTER — Ambulatory Visit
Admission: RE | Admit: 2019-01-16 | Discharge: 2019-01-16 | Disposition: A | Discharge status: Home / Self Care | Payer: Medicare HMO | Source: Ambulatory Visit | Attending: Internal Medicine | Admitting: Internal Medicine

## 2019-01-16 DIAGNOSIS — E1169 Type 2 diabetes mellitus with other specified complication: Secondary | ICD-10-CM | POA: Diagnosis not present

## 2019-01-16 DIAGNOSIS — Z7984 Long term (current) use of oral hypoglycemic drugs: Secondary | ICD-10-CM | POA: Diagnosis not present

## 2019-01-16 DIAGNOSIS — R0609 Other forms of dyspnea: Principal | ICD-10-CM

## 2019-01-16 DIAGNOSIS — R0600 Dyspnea, unspecified: Secondary | ICD-10-CM | POA: Diagnosis not present

## 2019-01-20 ENCOUNTER — Other Ambulatory Visit: Payer: Self-pay | Admitting: Internal Medicine

## 2019-01-20 DIAGNOSIS — R911 Solitary pulmonary nodule: Secondary | ICD-10-CM

## 2019-01-26 ENCOUNTER — Ambulatory Visit
Admission: RE | Admit: 2019-01-26 | Discharge: 2019-01-26 | Disposition: A | Discharge status: Home / Self Care | Payer: Medicare HMO | Source: Ambulatory Visit | Attending: Internal Medicine | Admitting: Internal Medicine

## 2019-01-26 DIAGNOSIS — R918 Other nonspecific abnormal finding of lung field: Secondary | ICD-10-CM | POA: Diagnosis not present

## 2019-01-26 DIAGNOSIS — H401131 Primary open-angle glaucoma, bilateral, mild stage: Secondary | ICD-10-CM | POA: Diagnosis not present

## 2019-01-26 DIAGNOSIS — R911 Solitary pulmonary nodule: Secondary | ICD-10-CM

## 2019-01-29 ENCOUNTER — Other Ambulatory Visit: Payer: Self-pay | Admitting: Internal Medicine

## 2019-01-29 ENCOUNTER — Other Ambulatory Visit (HOSPITAL_COMMUNITY): Payer: Self-pay | Admitting: Internal Medicine

## 2019-01-29 DIAGNOSIS — J9859 Other diseases of mediastinum, not elsewhere classified: Secondary | ICD-10-CM

## 2019-02-02 DIAGNOSIS — H401131 Primary open-angle glaucoma, bilateral, mild stage: Secondary | ICD-10-CM | POA: Diagnosis not present

## 2019-02-02 DIAGNOSIS — H2513 Age-related nuclear cataract, bilateral: Secondary | ICD-10-CM | POA: Diagnosis not present

## 2019-02-02 DIAGNOSIS — H2512 Age-related nuclear cataract, left eye: Secondary | ICD-10-CM | POA: Diagnosis not present

## 2019-02-05 NOTE — Progress Notes (Signed)
Cardiology Office Note   Date:  02/12/2019   ID:  Jessica Jacobs, DOB [birth year], MRN [id number]  PCP:  Seward Carol, MD  Cardiologist:   Jenkins Rouge, MD   No chief complaint on file.     History of Present Illness: Jessica Jacobs is a 73 y.o. female who presents for dyspnea on exertion  Referred by Dr Maudry Mayhew She has poorly controlled DM, HLD and HTN.  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 Loves potatoes and rice which is why her diabetes is poorly controlled  She indicates Dr Servando Snare is not planning on rushing into surgery    Past Medical History:  Diagnosis Date  . Anemia   . BACK PAIN, LUMBAR 12/07/2008   Qualifier: Diagnosis of  By: Radene Ou MD, [place]    . Cellulitis and abscess of other specified site 01/26/2008   Qualifier: Diagnosis of  By: Radene Ou MD, [place]    . COLECTOMY, PARTIAL, WITH ANASTOMOSIS, HX OF 08/26/2007   Qualifier: Diagnosis of  By: Radene Ou MD, [place]    . Diabetes mellitus without complication ([place])   . DIABETES MELLITUS, TYPE II 08/26/2007   Qualifier: Diagnosis of  By: Radene Ou MD, [place]    . DIVERTICULITIS, HX OF 08/26/2007   Qualifier: Diagnosis of  By: Radene Ou MD, [place]    . GUAIAC POSITIVE STOOL 10/06/2010   Annotation: Dr. Velora Heckler rec 10.2011:  repeat hemoccults; if + and anemic get  EGD Qualifier: Diagnosis of  By: Jorene Minors, [place]    . HEADACHE 11/05/2007   Qualifier: Diagnosis of  By: Radene Ou MD, [place]    . HYPERLIPIDEMIA 08/26/2007   Qualifier: Diagnosis of  By: Radene Ou MD, [place]    . Hypertension   . OBESITY 02/22/2011   Qualifier: Diagnosis of  By: Hassell Done FNP, Tori Milks    . ONYCHOMYCOSIS 10/26/2009   Qualifier: Diagnosis of  By: Jorene Minors, [place]    . PHARYNGITIS 12/21/2010   Qualifier: Diagnosis of  By: Hassell Done FNP, Tori Milks    . SEBACEOUS CYST, INFECTED 01/26/2008   Qualifier: Diagnosis of  By: Radene Ou MD, [place]    . SKIN TAG 12/19/2009   Qualifier: Diagnosis of  By: Jorene Minors, [place]    . URI 08/10/2008   Qualifier: Diagnosis of  By: Radene Ou MD, [place]    . VAGINITIS, CANDIDAL 02/28/2010   Qualifier: Diagnosis of  By: Jorene Minors, [place]    . WEIGHT GAIN 06/15/2008   Qualifier: Diagnosis of  By: Radene Ou MD, [place]      Past Surgical History:  Procedure Laterality Date  . ABDOMINAL HYSTERECTOMY    . BACK SURGERY    . BREAST LUMPECTOMY WITH RADIOACTIVE SEED LOCALIZATION Left 08/26/2018   Procedure: LEFT BREAST LUMPECTOMY WITH RADIOACTIVE SEED LOCALIZATION;  Surgeon: Erroll Luna, MD;  Location: [facility];  Service: General;  Laterality: Left;     Current Outpatient Medications  Medication Sig Dispense Refill  . Cholecalciferol (VITAMIN D-3 PO) Take by mouth.    . gabapentin (  NEURONTIN) 300 MG capsule Take 300 mg by mouth 3 (three) times daily. Take 300mg  every morning and 600mg  every night    . glimepiride (AMARYL) 4 MG tablet Take 4 mg by mouth 2 (two) times daily.    . hydrochlorothiazide (HYDRODIURIL) 25 MG tablet Take 25 mg by mouth daily.    Marland Kitchen ibuprofen (ADVIL,MOTRIN) 800 MG tablet Take 1 tablet (800 mg total) by mouth every 8 (eight) hours as needed. 30 tablet 0  . IRON PO Take by mouth.    Marland Kitchen lisinopril (PRINIVIL,ZESTRIL) 40 MG tablet TAKE ONE TABLET BY MOUTH EVERY DAY IN THE MORNING 30 tablet 6  . lovastatin (MEVACOR) 40 MG tablet Take 40 mg by mouth at bedtime.    . metFORMIN (GLUCOPHAGE) 1000 MG tablet TAKE ONE TABLET BY MOUTH TWICE DAILY 60 tablet 1  . METOPROLOL TARTRATE  PO Take by mouth.    . sitaGLIPtin (JANUVIA) 100 MG tablet Take 100 mg by mouth daily.    . vitamin B-12 (CYANOCOBALAMIN) 1000 MCG tablet Take 1,000 mcg by mouth daily.     No current facility-administered medications for this visit.     Allergies:   Patient has no known allergies.    Social History:  The patient  reports that she has quit smoking. She has never used smokeless tobacco. She reports current drug use. Drug: Marijuana. She reports that she does not drink alcohol.   Family History:  The patient's family history includes Cirrhosis in her father.    ROS:  Please see the history of present illness.   Otherwise, review of systems are positive for none.   All other systems are reviewed and negative.    PHYSICAL EXAM: VS:  BP 116/72   Pulse 67   Ht 5\' 5"  (1.651 m)   Wt 192 lb (87.1 kg)   SpO2 96%   BMI 31.95 kg/m  , BMI Body mass index is 31.95 kg/m. Affect appropriate Healthy:  appears stated age 17: normal Neck supple with no adenopathy JVP normal no bruits no thyromegaly Lungs clear with no wheezing and good diaphragmatic motion Heart:  S1/S2 no murmur, no rub, gallop or click PMI normal  Post left breast lumpectomy  Abdomen: benighn, BS positve, no tenderness, no AAA no bruit.  No HSM or HJR Distal pulses intact with no bruits No edema Neuro non-focal Skin warm and dry No muscular weakness    EKG:  08/21/18 SR rate 65 LAD nonspecific ST changes    Recent Labs: 08/21/2018: Platelets 345 01/10/2019: BUN 20; Creatinine, Ser 1.20; Hemoglobin 13.6; Potassium 4.6; Sodium 138    Lipid Panel    Component Value Date/Time   CHOL 148 03/06/2011 2032   TRIG 83 03/06/2011 2032   HDL 47 03/06/2011 2032   CHOLHDL 3.1 Ratio 03/06/2011 2032   VLDL 17 03/06/2011 2032   LDLCALC 84 03/06/2011 2032      Wt Readings from Last 3 Encounters:  02/12/19 192 lb (87.1 kg)  02/10/19 190 lb (86.2 kg)  08/26/18 190 lb 3.2 oz (86.3 kg)      Other studies  Reviewed: Additional studies/ records that were reviewed today include: Notes from Dr Delfina Redwood ER notes labs and CT and ECG.    ASSESSMENT AND PLAN:  1.  HTN:  Well controlled.  Continue current medications and low sodium Dash type diet.   2.  HLD:  Continue mevacor labs with primary 3. DM:  Discussed low carb diet.  Target hemoglobin A1c is 6.5 or less.  Continue current medications. 4.  Preoperative:  She may need major thoracic surgery. She has poorly controlled DM with abnormal ECG and exertional dyspnea Will order TTE to assess LV/RV function r/o HOCM also will order Lexiscan Myovue ot r/o CAD and dyspnea as anginal equivalent     Current medicines are reviewed at length with the patient today.  The patient does not have concerns regarding medicines.  The following changes have been made:  no change  Labs/ tests ordered today include: TTE, Myovue  No orders of the defined types were placed in this encounter.    Disposition:   FU with cardiology PRN      Signed, Jenkins Rouge, MD  02/12/2019 9:43 AM    [facility] [place], [place], [place]  [address] Phone: [phone]; Fax: [phone]

## 2019-02-09 ENCOUNTER — Ambulatory Visit (HOSPITAL_COMMUNITY)
Admission: RE | Admit: 2019-02-09 | Discharge: 2019-02-09 | Disposition: A | Discharge status: Home / Self Care | Payer: Medicare HMO | Source: Ambulatory Visit | Attending: Internal Medicine | Admitting: Internal Medicine

## 2019-02-09 DIAGNOSIS — J9859 Other diseases of mediastinum, not elsewhere classified: Secondary | ICD-10-CM | POA: Diagnosis not present

## 2019-02-09 LAB — GLUCOSE, CAPILLARY: GLUCOSE-CAPILLARY: 87 mg/dL (ref 70–99)

## 2019-02-09 MED ORDER — FLUDEOXYGLUCOSE F - 18 (FDG) INJECTION
9.4000 | Freq: Once | INTRAVENOUS | Status: AC | PRN
  Administered 2019-02-09: 9.4 via INTRAVENOUS

## 2019-02-10 ENCOUNTER — Institutional Professional Consult (permissible substitution) (INDEPENDENT_AMBULATORY_CARE_PROVIDER_SITE_OTHER): Payer: Medicare HMO | Admitting: Cardiothoracic Surgery

## 2019-02-10 VITALS — BP 118/72 | HR 73 | Resp 20 | Ht 65.0 in | Wt 190.0 lb

## 2019-02-10 DIAGNOSIS — E328 Other diseases of thymus: Secondary | ICD-10-CM

## 2019-02-10 NOTE — Progress Notes (Signed)
[address]       [name] [address]             [id number]                    [facility] Medical Record #[id number] Date of Birth: 1946/07/13  Referring: Seward Carol, MD Primary Care: Seward Carol, MD Primary Cardiologist: No primary care provider on file.  Chief Complaint:    Chief Complaint  Patient presents with  . Mediastinal Mass    Surgical eval, PET Scan 02/09/19, Chest CT 01/26/19    History of Present Illness:    Jessica Jacobs 73 y.o. female is seen in the office  today for after abnormal chest xray lead to ct of chest And PET scan.      Current Activity/ Functional Status:  Patient is independent with mobility/ambulation, transfers, ADL's, IADL's.   Zubrod Score: At the time of surgery this patient's most appropriate activity status/level should be described as: []     0    Normal activity, no symptoms []     1    Restricted in physical strenuous activity but ambulatory, able to do out light work []     2    Ambulatory and capable of self care, unable to do work activities, up and about               >50 % of waking hours                              []     3    Only limited self care, in bed greater than 50% of waking hours []     4    Completely disabled, no self care, confined to bed or chair []     5    Moribund   Past Medical History:  Diagnosis Date  . Anemia   . BACK PAIN, LUMBAR 12/07/2008   Qualifier: Diagnosis of  By: Radene Ou MD, [place]    . Cellulitis and abscess of other specified site 01/26/2008   Qualifier: Diagnosis of  By: Radene Ou MD, [place]    . COLECTOMY, PARTIAL, WITH ANASTOMOSIS, HX OF 08/26/2007   Qualifier: Diagnosis of  By: Radene Ou MD, [place]    . Diabetes mellitus without complication ([place])   . DIABETES MELLITUS, TYPE II 08/26/2007   Qualifier: Diagnosis of  By: Radene Ou MD, [place]    . DIVERTICULITIS, HX OF 08/26/2007   Qualifier: Diagnosis of  By: Radene Ou MD, [place]    . GUAIAC  POSITIVE STOOL 10/06/2010   Annotation: Dr. Velora Heckler rec 10.2011:  repeat hemoccults; if + and anemic get  EGD Qualifier: Diagnosis of  By: Jorene Minors, [place]    . HEADACHE 11/05/2007   Qualifier: Diagnosis of  By: Radene Ou MD, [place]    . HYPERLIPIDEMIA 08/26/2007   Qualifier: Diagnosis of  By: Radene Ou MD, [place]    . Hypertension   . OBESITY 02/22/2011   Qualifier: Diagnosis of  By: Hassell Done FNP, Tori Milks    . ONYCHOMYCOSIS 10/26/2009   Qualifier: Diagnosis of  By: Jorene Minors, [place]    . PHARYNGITIS 12/21/2010   Qualifier: Diagnosis of  By: Hassell Done FNP, Tori Milks    . SEBACEOUS CYST, INFECTED 01/26/2008   Qualifier: Diagnosis of  By: Radene Ou MD, [place]    . SKIN TAG 12/19/2009   Qualifier: Diagnosis of  By: Jorene Minors, [place]    .  URI 08/10/2008   Qualifier: Diagnosis of  By: Radene Ou MD, [place]    . VAGINITIS, CANDIDAL 02/28/2010   Qualifier: Diagnosis of  By: Jorene Minors, [place]    . WEIGHT GAIN 06/15/2008   Qualifier: Diagnosis of  By: Radene Ou MD, [place]      Past Surgical History:  Procedure Laterality Date  . ABDOMINAL HYSTERECTOMY    . BACK SURGERY    . BREAST LUMPECTOMY WITH RADIOACTIVE SEED LOCALIZATION Left 08/26/2018   Procedure: LEFT BREAST LUMPECTOMY WITH RADIOACTIVE SEED LOCALIZATION;  Surgeon: Erroll Luna, MD;  Location: [facility];  Service: General;  Laterality: Left;    Family History  Problem Relation Age of Onset  . Cirrhosis Father   . Breast cancer Neg Hx      Social History   Tobacco Use  Smoking Status Former Smoker  Smokeless Tobacco Never Used  Tobacco Comment   quit 5 years ago    Social History   Substance and Sexual Activity  Alcohol Use No     No Known Allergies  Current Outpatient Medications  Medication Sig Dispense Refill  . Cholecalciferol (VITAMIN D-3 PO) Take by mouth.    . gabapentin (NEURONTIN) 300 MG capsule Take 300 mg by mouth 3 (three) times daily. Take 300mg  every morning and 600mg  every night    .  glimepiride (AMARYL) 4 MG tablet Take 4 mg by mouth 2 (two) times daily.    . hydrochlorothiazide (HYDRODIURIL) 25 MG tablet Take 25 mg by mouth daily.    Marland Kitchen ibuprofen (ADVIL,MOTRIN) 800 MG tablet Take 1 tablet (800 mg total) by mouth every 8 (eight) hours as needed. 30 tablet 0  . IRON PO Take by mouth.    Marland Kitchen lisinopril (PRINIVIL,ZESTRIL) 40 MG tablet TAKE ONE TABLET BY MOUTH EVERY DAY IN THE MORNING 30 tablet 6  . lovastatin (MEVACOR) 40 MG tablet Take 40 mg by mouth at bedtime.    . metFORMIN (GLUCOPHAGE) 1000 MG tablet TAKE ONE TABLET BY MOUTH TWICE DAILY 60 tablet 1  . METOPROLOL TARTRATE PO Take by mouth.    . sitaGLIPtin (JANUVIA) 100 MG tablet Take 100 mg by mouth daily.    . vitamin B-12 (CYANOCOBALAMIN) 1000 MCG tablet Take 1,000 mcg by mouth daily.     No current facility-administered medications for this visit.     Pertinent items are noted in HPI.   Review of Systems:     Cardiac Review of Systems: [Y] = yes  or   [ N ] = no   Chest Pain [    ]  Resting SOB [   ] Exertional SOB  [  ]  Orthopnea [  ]   Pedal Edema [   ]    Palpitations [  ] Syncope  [  ]   Presyncope [   ]   General Review of Systems: [Y] = yes [  ]=no Constitional: recent weight change [  ];  Wt loss over the last 3 months [   ] anorexia [  ]; fatigue [  ]; nausea [  ]; night sweats [  ]; fever [  ]; or chills [  ];           Eye : blurred vision [  ]; diplopia [   ]; vision changes [  ];  Amaurosis fugax[  ]; Resp: cough [  ];  wheezing[  ];  hemoptysis[  ]; shortness of breath[  ]; paroxysmal nocturnal dyspnea[  ]; dyspnea on  exertion[  ]; or orthopnea[  ];  GI:  gallstones[  ], vomiting[  ];  dysphagia[  ]; melena[  ];  hematochezia [  ]; heartburn[  ];   Hx of  Colonoscopy[  ]; GU: kidney stones [  ]; hematuria[  ];   dysuria [  ];  nocturia[  ];  history of     obstruction [  ]; urinary frequency [  ]             Skin: rash, swelling[  ];, hair loss[  ];  peripheral edema[  ];  or itching[   ]; Musculosketetal: myalgias[  ];  joint swelling[  ];  joint erythema[  ];  joint pain[  ];  back pain[  ];  Heme/Lymph: bruising[  ];  bleeding[  ];  anemia[  ];  Neuro: TIA[  ];  headaches[  ];  stroke[  ];  vertigo[  ];  seizures[  ];   paresthesias[  ];  difficulty walking[  ];  Psych:depression[  ]; anxiety[  ];  Endocrine: diabetes[  ];  thyroid dysfunction[  ];  Immunizations: Flu up to date [  ]; Pneumococcal up to date [  ];  Other:     PHYSICAL EXAMINATION: BP 118/72   Pulse 73   Resp 20   Ht 5\' 5"  (1.651 m)   Wt 190 lb (86.2 kg)   SpO2 92% Comment: RA  BMI 31.62 kg/m  General appearance: alert and cooperative Head: Normocephalic, without obvious abnormality, atraumatic Neck: no adenopathy, no carotid bruit, no JVD, supple, symmetrical, trachea midline and thyroid not enlarged, symmetric, no tenderness/mass/nodules Lymph nodes: Cervical, supraclavicular, and axillary nodes normal. Resp: clear to auscultation bilaterally Back: symmetric, no curvature. ROM normal. No CVA tenderness. Cardio: regular rate and rhythm, S1, S2 normal, no murmur, click, rub or gallop GI: soft, non-tender; bowel sounds normal; no masses,  no organomegaly Extremities: extremities normal, atraumatic, no cyanosis or edema Neurologic: Grossly normal  Diagnostic Studies & Laboratory data:     Recent Radiology Findings:   Ct Chest Wo Contrast  Result Date: 01/27/2019 CLINICAL DATA:  Lung nodule EXAM: CT CHEST WITHOUT CONTRAST TECHNIQUE: Multidetector CT imaging of the chest was performed following the standard protocol without IV contrast. COMPARISON:  Chest x-ray from 10 days ago FINDINGS: Cardiovascular: Normal heart size. No pericardial effusion. Fat within the right and left ventricles without apparent wall thickening or discrete appearance, not convincingly from prior infarct. There is aortic and coronary atherosclerosis. Mediastinum/Nodes: Lobulated anterior mediastinal mass measuring 10 x 5.5  x 3.2 cm. This is along the fatty thymus and is homogeneous. No hilar or middle mediastinal adenopathy. No IMA adenopathy. Lungs/Pleura: Mild airway thickening and minimal centrilobular emphysema. Crescentic right upper lobe opacity along a fatty mass spanning the for intercostal space. The mass has simple fatty internal architecture and measures 4.4 x 2.2 cm on axial slices. The pulmonary opacity is presumably scarring. There are a few subpleural flat nodules which likely reflect lymph nodes. Anticipate CT follow-up. Upper Abdomen: Negative Musculoskeletal: No acute or aggressive finding IMPRESSION: 1. 10 x 5.5 x 3.2 cm anterior mediastinal mass, favor thymic neoplasm over lymphoma. No pleural effusion or soft tissue pleural nodularity 2. The right-sided lung opacity on prior chest x-ray is related to a lipoma traversing the fourth intercostal space, seemingly unrelated to #1. Electronically Signed   By: Monte Fantasia M.D.   On: 01/27/2019 05:13   Nm Pet Image Initial (pi) Skull Base To Thigh  Result Date: 02/09/2019 CLINICAL DATA:  Initial treatment strategy for mediastinal mass. EXAM: NUCLEAR MEDICINE PET SKULL BASE TO THIGH TECHNIQUE: 9.4 mCi F-18 FDG was injected intravenously. Full-ring PET imaging was performed from the skull base to thigh after the radiotracer. CT data was obtained and used for attenuation correction and anatomic localization. Fasting blood glucose: 87 mg/dl COMPARISON:  CT chest from 01/26/2019 FINDINGS: Mediastinal blood pool activity: SUV max 2.9 NECK: Physiologic appearing activity along the tonsils with minimal right eccentricity but without significant CT correlate. Incidental CT findings: none CHEST: The hypodense anterior mediastinal structure has near fluid density and is photopenic. This primarily fatty mass of the right chest wall deep to the serratus anterior which bulges in between the fourth and fifth ribs into the thoracic cavity, with some adjacent mild ground-glass  density opacity. Accentuated metabolic activity along this ground-glass density opacity, maximum SUV 5.4. A left axillary lymph node on image 54/4 measures 0.8 cm in short axis and has maximum SUV of 4.1. Incidental CT findings: Coronary, aortic arch, and branch vessel atherosclerotic vascular disease. Fatty density along the cardiac apex likely from a prior myocardial infarction. ABDOMEN/PELVIS: Widespread accentuated activity in bowel is thought to be physiologic. Incidental CT findings: Aortoiliac atherosclerotic vascular disease. SKELETON: No significant abnormal hypermetabolic activity in this region. Incidental CT findings: Lower lumbar degenerative facet arthropathy. Old likely nonunited or partially united fracture of the left distal clavicle. Bilateral pre-acromial os acromiale. IMPRESSION: 1. The anterior mediastinal lesion is fluid density, homogeneous, and photopenic on the PET portion of the exam. 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 Pericardial cyst is a differential diagnostic consideration. 2. 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 I am uncertain whether this is originally extrathoracic and grew through the intercostal space, or whether it originated in the intercostal space itself. There is some mildly hypermetabolic activity in the adjacent ground-glass opacity in the lung which is presumably inflammatory, although with a maximum SUV up to 5.4. Some authors (such as Vance Peper all, J Thoracic Disease, 2015 Oct; 7(10): [phone]) recommend elective excision of such fatty masses even when asymptomatic due to the potential for enlargement and potential for aggressive behavior, and the difficulty excluding liposarcoma. Thoracic surgical referral should be recommended. If surgery is not elected, surveillance imaging by CT in 6 months time would be recommended  to ensure lack of growth and also to follow up the adjacent ground-glass airspace opacity. 3. Faintly accentuated metabolic activity in a small left axillary lymph node, probably incidental. 4. Other imaging findings of potential clinical significance: Aortic Atherosclerosis (ICD10-I70.0). Coronary atherosclerosis. Fat density along the cardiac apex suggesting prior myocardial infarction. Electronically Signed   By: Van Clines M.D.   On: 02/09/2019 11:13     I have independently reviewed the above radiology studies  and reviewed the findings with the patient.   Recent Lab Findings: Lab Results  Component Value Date   WBC 8.1 08/21/2018   HGB 13.6 01/10/2019   HCT 40.0 01/10/2019   PLT 345 08/21/2018   GLUCOSE 224 (H) 01/10/2019   CHOL 148 03/06/2011   TRIG 83 03/06/2011   HDL 47 03/06/2011   LDLCALC 84 03/06/2011   ALT 14 03/06/2011   AST 15 03/06/2011   NA 138 01/10/2019   K 4.6 01/10/2019   CL 99 01/10/2019   CREATININE 1.20 (H) 01/10/2019   BUN 20 01/10/2019  CO2 31 08/21/2018   TSH 0.898 03/06/2011   HGBA1C 6.2 02/22/2011      Assessment / Plan:   1/ THYMIC CYST- anterior mediastinal lesion fluid density, homogeneous, and photopenic on the PET portion of the exam. 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

## 2019-02-12 ENCOUNTER — Ambulatory Visit (INDEPENDENT_AMBULATORY_CARE_PROVIDER_SITE_OTHER): Payer: Medicare HMO | Admitting: Cardiovascular Disease

## 2019-02-12 ENCOUNTER — Encounter

## 2019-02-12 ENCOUNTER — Encounter: Payer: Self-pay | Admitting: Cardiovascular Disease

## 2019-02-12 VITALS — BP 116/72 | HR 67 | Ht 65.0 in | Wt 192.0 lb

## 2019-02-12 DIAGNOSIS — Z01818 Encounter for other preprocedural examination: Secondary | ICD-10-CM | POA: Diagnosis not present

## 2019-02-12 DIAGNOSIS — E785 Hyperlipidemia, unspecified: Secondary | ICD-10-CM

## 2019-02-12 DIAGNOSIS — I10 Essential (primary) hypertension: Secondary | ICD-10-CM

## 2019-02-12 DIAGNOSIS — R9431 Abnormal electrocardiogram [ECG] [EKG]: Secondary | ICD-10-CM | POA: Diagnosis not present

## 2019-02-12 NOTE — Patient Instructions (Addendum)
Medication Instructions:   If you need a refill on your cardiac medications before your next appointment, please call your pharmacy.   Lab work:  If you have labs (blood work) drawn today and your tests are completely normal, you will receive your results only by: Marland Kitchen MyChart Message (if you have MyChart) OR . A paper copy in the mail If you have any lab test that is abnormal or we need to change your treatment, we will call you to review the results.  Testing/Procedures: Your physician has requested that you have an echocardiogram. Echocardiography is a painless test that uses sound waves to create images of your heart. It provides your doctor with information about the size and shape of your heart and how well your heart's chambers and valves are working. This procedure takes approximately one hour. There are no restrictions for this procedure.  Your physician has requested that you have a lexiscan myoview. For further information please visit [email or web address]. Please follow instruction sheet, as given.  Follow-Up: At [facility], you and your health needs are our priority.  As part of our continuing mission to provide you with exceptional heart care, we have created designated Provider Care Teams.  These Care Teams include your primary Cardiologist (physician) and Advanced Practice Providers (APPs -  Physician Assistants and Nurse Practitioners) who all work together to provide you with the care you need, when you need it. Your physician recommends that you schedule a follow-up appointment as needed with Dr. Johnsie Cancel.

## 2019-02-17 ENCOUNTER — Encounter: Payer: Medicare HMO | Admitting: Cardiothoracic Surgery

## 2019-02-18 ENCOUNTER — Encounter: Payer: Medicare HMO | Admitting: Cardiothoracic Surgery

## 2019-02-19 DIAGNOSIS — H5202 Hypermetropia, left eye: Secondary | ICD-10-CM | POA: Diagnosis not present

## 2019-02-19 DIAGNOSIS — H2511 Age-related nuclear cataract, right eye: Secondary | ICD-10-CM | POA: Diagnosis not present

## 2019-02-19 DIAGNOSIS — H2512 Age-related nuclear cataract, left eye: Secondary | ICD-10-CM | POA: Diagnosis not present

## 2019-02-19 DIAGNOSIS — H401121 Primary open-angle glaucoma, left eye, mild stage: Secondary | ICD-10-CM | POA: Diagnosis not present

## 2019-02-19 DIAGNOSIS — H52222 Regular astigmatism, left eye: Secondary | ICD-10-CM | POA: Diagnosis not present

## 2019-02-19 DIAGNOSIS — H401122 Primary open-angle glaucoma, left eye, moderate stage: Secondary | ICD-10-CM | POA: Diagnosis not present

## 2019-02-19 DIAGNOSIS — Z961 Presence of intraocular lens: Secondary | ICD-10-CM | POA: Diagnosis not present

## 2019-02-20 DIAGNOSIS — H2511 Age-related nuclear cataract, right eye: Secondary | ICD-10-CM | POA: Diagnosis not present

## 2019-02-21 ENCOUNTER — Ambulatory Visit (HOSPITAL_COMMUNITY)
Admission: EM | Admit: 2019-02-21 | Discharge: 2019-02-21 | Disposition: A | Discharge status: Home / Self Care | Payer: Medicare HMO | Attending: Family Medicine | Admitting: Family Medicine

## 2019-02-21 ENCOUNTER — Ambulatory Visit (INDEPENDENT_AMBULATORY_CARE_PROVIDER_SITE_OTHER): Payer: Medicare HMO

## 2019-02-21 ENCOUNTER — Encounter (HOSPITAL_COMMUNITY): Payer: Self-pay

## 2019-02-21 DIAGNOSIS — M79602 Pain in left arm: Secondary | ICD-10-CM | POA: Diagnosis not present

## 2019-02-21 DIAGNOSIS — W19XXXA Unspecified fall, initial encounter: Secondary | ICD-10-CM

## 2019-02-21 DIAGNOSIS — S42295A Other nondisplaced fracture of upper end of left humerus, initial encounter for closed fracture: Secondary | ICD-10-CM

## 2019-02-21 DIAGNOSIS — M79632 Pain in left forearm: Secondary | ICD-10-CM | POA: Diagnosis not present

## 2019-02-21 DIAGNOSIS — S42455A Nondisplaced fracture of lateral condyle of left humerus, initial encounter for closed fracture: Secondary | ICD-10-CM | POA: Diagnosis not present

## 2019-02-21 DIAGNOSIS — S59912A Unspecified injury of left forearm, initial encounter: Secondary | ICD-10-CM | POA: Diagnosis not present

## 2019-02-21 MED ORDER — TRAMADOL HCL 50 MG PO TABS: 50.0000 mg | ORAL_TABLET | Freq: Three times a day (TID) | ORAL | 0 refills | Status: DC | PRN

## 2019-02-21 MED ORDER — ACETAMINOPHEN 500 MG PO TABS: 500.0000 mg | ORAL_TABLET | Freq: Four times a day (QID) | ORAL | 0 refills | Status: DC | PRN

## 2019-02-21 NOTE — ED Triage Notes (Signed)
Pt presents with left shoulder, upper arm, and elbow pain from fall injury last night.

## 2019-02-21 NOTE — Discharge Instructions (Signed)
Please wear sling to immobilize the shoulder.  Ice application.  Tylenol every 6 hours.  May use tramadol as needed for severe pain. May cause drowsiness. Please do not take if driving or drinking alcohol. Discontinue if causes too much drowsiness.  Please call orthopedics on Monday for follow up appointment next week.

## 2019-02-21 NOTE — ED Provider Notes (Signed)
[facility]    CSN: [id number] Arrival date & time: 02/21/19  1005     History   Chief Complaint Chief Complaint  Patient presents with  . Fall  . Arm Injury    HPI Jessica Jacobs is a 73 y.o. female.   Jessica Jacobs presents with complaints of left shoulder pain after fall last night. She tripped on a shoe, causing her to land on her left arm/shoulder. Immediate pain and pain since. Limited ROM to left arm due to shoulder pain. Swelling extends to left hand and wrist. No numbness or tingling. No previous shoulder injury. Pain 10/10 with any movement. Had cataract removal two days ago, eye feels well. Hasn't taken any medications for pain. No other injury, didn't hit head or lose consciousness. Hx fo anemia, dm, back pain.    ROS per HPI.      Past Medical History:  Diagnosis Date  . Anemia   . BACK PAIN, LUMBAR 12/07/2008   Qualifier: Diagnosis of  By: Radene Ou MD, [place]    . Cellulitis and abscess of other specified site 01/26/2008   Qualifier: Diagnosis of  By: Radene Ou MD, [place]    . COLECTOMY, PARTIAL, WITH ANASTOMOSIS, HX OF 08/26/2007   Qualifier: Diagnosis of  By: Radene Ou MD, [place]    . Diabetes mellitus without complication ([place])   . DIABETES MELLITUS, TYPE II 08/26/2007   Qualifier: Diagnosis of  By: Radene Ou MD, [place]    . DIVERTICULITIS, HX OF 08/26/2007   Qualifier: Diagnosis of  By: Radene Ou MD, [place]    . GUAIAC POSITIVE STOOL 10/06/2010   Annotation: Dr. Velora Heckler rec 10.2011:  repeat hemoccults; if + and anemic get  EGD Qualifier: Diagnosis of  By: Jorene Minors, [place]    . HEADACHE 11/05/2007   Qualifier: Diagnosis of  By: Radene Ou MD, [place]    . HYPERLIPIDEMIA 08/26/2007   Qualifier: Diagnosis of  By: Radene Ou MD, [place]    . Hypertension   . OBESITY 02/22/2011   Qualifier: Diagnosis of  By: Hassell Done FNP, Tori Milks    . ONYCHOMYCOSIS 10/26/2009   Qualifier: Diagnosis of  By: Jorene Minors, Scott    . PHARYNGITIS 12/21/2010   Qualifier:  Diagnosis of  By: Hassell Done FNP, Tori Milks    . SEBACEOUS CYST, INFECTED 01/26/2008   Qualifier: Diagnosis of  By: Radene Ou MD, [place]    . SKIN TAG 12/19/2009   Qualifier: Diagnosis of  By: Jorene Minors, Scott    . URI 08/10/2008   Qualifier: Diagnosis of  By: Radene Ou MD, [place]    . VAGINITIS, CANDIDAL 02/28/2010   Qualifier: Diagnosis of  By: Jorene Minors, Scott    . WEIGHT GAIN 06/15/2008   Qualifier: Diagnosis of  By: Radene Ou MD, [place]      Patient Active Problem List   Diagnosis Date Noted  . OBESITY 02/22/2011  . PHARYNGITIS 12/21/2010  . GUAIAC POSITIVE STOOL 10/06/2010  . VAGINITIS, CANDIDAL 02/28/2010  . SKIN TAG 12/19/2009  . ONYCHOMYCOSIS 10/26/2009  . BACK PAIN, LUMBAR 12/07/2008  . URI 08/10/2008  . WEIGHT GAIN 06/15/2008  . CELLULITIS AND ABSCESS OF OTHER SPECIFIED SITE 01/26/2008  . SEBACEOUS CYST, INFECTED 01/26/2008  . HEADACHE 11/05/2007  . DIABETES MELLITUS, TYPE II 08/26/2007  . HYPERLIPIDEMIA 08/26/2007  . HYPERTENSION 08/26/2007  . DIVERTICULITIS, HX OF 08/26/2007  . COLECTOMY, PARTIAL, WITH ANASTOMOSIS, HX OF 08/26/2007    Past Surgical History:  Procedure Laterality Date  . ABDOMINAL HYSTERECTOMY    . BACK SURGERY    . BREAST  LUMPECTOMY WITH RADIOACTIVE SEED LOCALIZATION Left 08/26/2018   Procedure: LEFT BREAST LUMPECTOMY WITH RADIOACTIVE SEED LOCALIZATION;  Surgeon: Erroll Luna, MD;  Location: [facility];  Service: General;  Laterality: Left;  . EYE SURGERY      OB History   No obstetric history on file.      Home Medications    Prior to Admission medications   Medication Sig Start Date End Date Taking? Authorizing Provider  acetaminophen (TYLENOL) 500 MG tablet Take 1 tablet (500 mg total) by mouth every 6 (six) hours as needed. 02/21/19   Zigmund Gottron, NP  Cholecalciferol (VITAMIN D-3 PO) Take by mouth.    [provider]  gabapentin (NEURONTIN) 300 MG capsule Take 300 mg by mouth 3 (three) times daily. Take  300mg  every morning and 600mg  every night    [provider]  glimepiride (AMARYL) 4 MG tablet Take 4 mg by mouth 2 (two) times daily.    [provider]  hydrochlorothiazide (HYDRODIURIL) 25 MG tablet Take 25 mg by mouth daily.    [provider]  ibuprofen (ADVIL,MOTRIN) 800 MG tablet Take 1 tablet (800 mg total) by mouth every 8 (eight) hours as needed. 08/26/18   Erroll Luna, MD  IRON PO Take by mouth.    [provider]  lisinopril (PRINIVIL,ZESTRIL) 40 MG tablet TAKE ONE TABLET BY MOUTH EVERY DAY IN THE MORNING 08/16/11   Richardson Dopp T, PA-C  lovastatin (MEVACOR) 40 MG tablet Take 40 mg by mouth at bedtime.    [provider]  metFORMIN (GLUCOPHAGE) 1000 MG tablet TAKE ONE TABLET BY MOUTH TWICE DAILY 08/16/11   Richardson Dopp T, PA-C  METOPROLOL TARTRATE PO Take by mouth.    [provider]  sitaGLIPtin (JANUVIA) 100 MG tablet Take 100 mg by mouth daily.    [provider]  traMADol (ULTRAM) 50 MG tablet Take 1 tablet (50 mg total) by mouth every 8 (eight) hours as needed for severe pain. 02/21/19   Zigmund Gottron, NP  vitamin B-12 (CYANOCOBALAMIN) 1000 MCG tablet Take 1,000 mcg by mouth daily.    [provider]    Family History Family History  Problem Relation Age of Onset  . Cirrhosis Father   . Breast cancer Neg Hx     Social History Social History   Tobacco Use  . Smoking status: Former Research scientist (life sciences)  . Smokeless tobacco: Never Used  . Tobacco comment: quit 5 years ago  Substance Use Topics  . Alcohol use: No  . Drug use: Yes    Types: Marijuana     Allergies   Patient has no known allergies.   Review of Systems Review of Systems   Physical Exam Triage Vital Signs ED Triage Vitals [02/21/19 1039]  Enc Vitals Group     BP (!) 153/94     Pulse Rate 82     Resp 18     Temp 98.3 F (36.8 C)     Temp Source Oral     SpO2 91 %     Weight      Height      Head Circumference      Peak Flow       Pain Score 10     Pain Loc      Pain Edu?      Excl. in [place]?    No data found.  Updated Vital Signs BP (!) 153/94 (BP Location: Right Arm)   Pulse 82   Temp  98.3 F (36.8 C) (Oral)   Resp 18   SpO2 91%    Physical Exam Constitutional:      General: She is not in acute distress.    Appearance: She is well-developed.  Cardiovascular:     Rate and Rhythm: Normal rate and regular rhythm.     Heart sounds: Normal heart sounds.  Pulmonary:     Effort: Pulmonary effort is normal.     Breath sounds: Normal breath sounds.  Musculoskeletal:     Left shoulder: She exhibits decreased range of motion, tenderness, bony tenderness, pain and decreased strength. She exhibits no swelling, no effusion, no crepitus, no deformity, no laceration, no spasm and normal pulse.     Left elbow: No tenderness found.     Left wrist: She exhibits swelling. She exhibits normal range of motion.     Left hand: Normal.     Comments: Swelling to left wrist with bruising noted to radial aspect; pain to upper arm with any movement or rotation of wrist and lower arm; left upper arm with pain on palpation and pain with any movement; equal radial pulses bilaterally; gross sensation intact; cap refill < 2 seconds    Skin:    General: Skin is warm and dry.  Neurological:     Mental Status: She is alert and oriented to person, place, and time.      UC Treatments / Results  Labs (all labs ordered are listed, but only abnormal results are displayed) Labs Reviewed - No data to display  EKG None  Radiology Dg Forearm Left  Result Date: 02/21/2019 CLINICAL DATA:  Trauma and pain. EXAM: LEFT FOREARM - 2 VIEW COMPARISON:  Humerus films of earlier today. FINDINGS: No acute fracture or dislocation. Probable soft tissue swelling about the distal forearm. IMPRESSION: No acute osseous abnormality. Electronically Signed   By: Abigail Miyamoto M.D.   On: 02/21/2019 11:14   Dg Humerus Left  Result Date:  02/21/2019 CLINICAL DATA:  Left upper extremity pain post fall. EXAM: LEFT HUMERUS - 2+ VIEW COMPARISON:  None. FINDINGS: There is an impacted mildly comminuted nondisplaced sub capitellar fracture of the left humerus. IMPRESSION: Impacted mildly comminuted nondisplaced sub capitellar fracture of the left humerus. Electronically Signed   By: Fidela Salisbury M.D.   On: 02/21/2019 10:58    Procedures Procedures (including critical care time)  Medications Ordered in UC Medications - No data to display  Initial Impression / Assessment and Plan / UC Course  I have reviewed the triage vital signs and the nursing notes.  Pertinent labs & imaging results that were available during my care of the patient were reviewed by me and considered in my medical decision making (see chart for details).     Left humerus fracture. Consulted with on call physician Dr. Lucia Gaskins- to be placed in sling and follow up in office next week, likely non -surgical need. Pain management provided to patient and education provided. Drowsy precautions discussed. Patient verbalized understanding and agreeable to plan.    Final Clinical Impressions(s) / UC Diagnoses   Final diagnoses:  Other closed nondisplaced fracture of proximal end of left humerus, initial encounter  Fall, initial encounter     Discharge Instructions     Please wear sling to immobilize the shoulder.  Ice application.  Tylenol every 6 hours.  May use tramadol as needed for severe pain. May cause drowsiness. Please do not take if driving or drinking alcohol. Discontinue if causes too much drowsiness.  Please  call orthopedics on Monday for follow up appointment next week.    ED Prescriptions    Medication Sig Dispense Auth. Provider   acetaminophen (TYLENOL) 500 MG tablet Take 1 tablet (500 mg total) by mouth every 6 (six) hours as needed. 30 tablet Augusto Gamble B, NP   traMADol (ULTRAM) 50 MG tablet Take 1 tablet (50 mg total) by mouth every 8  (eight) hours as needed for severe pain. 15 tablet Zigmund Gottron, NP     Controlled Substance Prescriptions [place] Controlled Substance Registry consulted? Not Applicable   Zigmund Gottron, NP 02/21/19 1124

## 2019-02-23 ENCOUNTER — Emergency Department (HOSPITAL_COMMUNITY)
Admission: EM | Admit: 2019-02-23 | Discharge: 2019-02-23 | Disposition: A | Discharge status: Home / Self Care | Payer: Medicare HMO | Attending: Emergency Medicine | Admitting: Emergency Medicine

## 2019-02-23 ENCOUNTER — Encounter (HOSPITAL_COMMUNITY): Payer: Self-pay | Admitting: Internal Medicine

## 2019-02-23 DIAGNOSIS — M79601 Pain in right arm: Secondary | ICD-10-CM | POA: Diagnosis not present

## 2019-02-23 DIAGNOSIS — Y999 Unspecified external cause status: Secondary | ICD-10-CM | POA: Insufficient documentation

## 2019-02-23 DIAGNOSIS — Y929 Unspecified place or not applicable: Secondary | ICD-10-CM | POA: Insufficient documentation

## 2019-02-23 DIAGNOSIS — Z7984 Long term (current) use of oral hypoglycemic drugs: Secondary | ICD-10-CM | POA: Insufficient documentation

## 2019-02-23 DIAGNOSIS — Y939 Activity, unspecified: Secondary | ICD-10-CM | POA: Insufficient documentation

## 2019-02-23 DIAGNOSIS — W010XXA Fall on same level from slipping, tripping and stumbling without subsequent striking against object, initial encounter: Secondary | ICD-10-CM | POA: Insufficient documentation

## 2019-02-23 DIAGNOSIS — Z79899 Other long term (current) drug therapy: Secondary | ICD-10-CM | POA: Diagnosis not present

## 2019-02-23 DIAGNOSIS — E119 Type 2 diabetes mellitus without complications: Secondary | ICD-10-CM | POA: Diagnosis not present

## 2019-02-23 DIAGNOSIS — S42201A Unspecified fracture of upper end of right humerus, initial encounter for closed fracture: Secondary | ICD-10-CM | POA: Diagnosis not present

## 2019-02-23 DIAGNOSIS — I10 Essential (primary) hypertension: Secondary | ICD-10-CM | POA: Insufficient documentation

## 2019-02-23 DIAGNOSIS — Z87891 Personal history of nicotine dependence: Secondary | ICD-10-CM | POA: Diagnosis not present

## 2019-02-23 MED ORDER — METHOCARBAMOL 750 MG PO TABS: 750.0000 mg | ORAL_TABLET | Freq: Three times a day (TID) | ORAL | 0 refills | Status: DC | PRN

## 2019-02-23 MED ORDER — HYDROCODONE-ACETAMINOPHEN 5-325 MG PO TABS
2.0000 | ORAL_TABLET | Freq: Once | ORAL | Status: AC
  Administered 2019-02-23: 2 via ORAL
  Filled 2019-02-23: qty 2

## 2019-02-23 NOTE — Discharge Instructions (Addendum)
It was our pleasure to provide your ER care today - we hope that you feel better.  Wear sling/shoulder immobilizer.   Icepack/cold to sore area.   You may take your ultram as need for pain. You may also take ibuprofen as need for pain. You may try robaxin as need for muscle spasm/pain.  Follow up with orthopedic doctor in the coming week - call office to arrange appointment.  Return to ER if worse, new symptoms, new or intractable pain, numbness/weakness, other concern.

## 2019-02-23 NOTE — ED Notes (Signed)
Sling repositioned for comfort and proper support. Pt verbalizes that she will get help placing the sling, she also states "it feels so much better already." \

## 2019-02-23 NOTE — ED Triage Notes (Signed)
Pt here for evaluation of arm pain from a fall that happened on February 21st. Pt was seen at urgent care on Feb 22nd and diagnosed with a fracture of proximal end of left humerus. She was ent home with arm sling, orthopedic follow-up, and prescriptions for tylenol and tramadol. Pt states she has been taking medications but has not had pain relief. No pain at rest, pain only with movement of left hand.

## 2019-02-23 NOTE — ED Notes (Signed)
ED Provider at bedside. 

## 2019-02-23 NOTE — ED Notes (Signed)
Pt verbalized understanding of discharge instructions and denies any further questions at this time.   

## 2019-02-23 NOTE — ED Provider Notes (Signed)
[facility] EMERGENCY DEPARTMENT Provider Note   CSN: [id number] Arrival date & time: 02/23/19  7673    History   Chief Complaint Chief Complaint  Patient presents with  . Arm Pain    HPI Jessica Jacobs is a 73 y.o. female.     Patient c/o mechanical fall 2 days ago (trip over shoe) falling onto left arm. Patient indicates was seen at another facility, had xrays, and was told left proximal humerus fracture. Since then she has noted persistent pain to shoulder area. Pain moderate, dull, non radiating, worse w arm movement. No arm numbness/weakness. With fall, denies head injury or LOC. No headache. Denies neck or back pain. No chest pain or sob. No abd pain or nv. Pt denies acute or abrupt worsening of pain, rather persistence of pain. She was given rx ultram - has not taken today.   The history is provided by the patient.  Arm Pain  Pertinent negatives include no chest pain, no abdominal pain, no headaches and no shortness of breath.    Past Medical History:  Diagnosis Date  . Anemia   . BACK PAIN, LUMBAR 12/07/2008   Qualifier: Diagnosis of  By: Radene Ou MD, [place]    . Cellulitis and abscess of other specified site 01/26/2008   Qualifier: Diagnosis of  By: Radene Ou MD, [place]    . COLECTOMY, PARTIAL, WITH ANASTOMOSIS, HX OF 08/26/2007   Qualifier: Diagnosis of  By: Radene Ou MD, [place]    . Diabetes mellitus without complication ([place])   . DIABETES MELLITUS, TYPE II 08/26/2007   Qualifier: Diagnosis of  By: Radene Ou MD, [place]    . DIVERTICULITIS, HX OF 08/26/2007   Qualifier: Diagnosis of  By: Radene Ou MD, [place]    . GUAIAC POSITIVE STOOL 10/06/2010   Annotation: Dr. Velora Heckler rec 10.2011:  repeat hemoccults; if + and anemic get  EGD Qualifier: Diagnosis of  By: Jorene Minors, [place]    . HEADACHE 11/05/2007   Qualifier: Diagnosis of  By: Radene Ou MD, [place]    . HYPERLIPIDEMIA 08/26/2007   Qualifier: Diagnosis of  By: Radene Ou MD, [place]    .  Hypertension   . OBESITY 02/22/2011   Qualifier: Diagnosis of  By: Hassell Done FNP, Tori Milks    . ONYCHOMYCOSIS 10/26/2009   Qualifier: Diagnosis of  By: Jorene Minors, [place]    . PHARYNGITIS 12/21/2010   Qualifier: Diagnosis of  By: Hassell Done FNP, Tori Milks    . SEBACEOUS CYST, INFECTED 01/26/2008   Qualifier: Diagnosis of  By: Radene Ou MD, [place]    . SKIN TAG 12/19/2009   Qualifier: Diagnosis of  By: Jorene Minors, [place]    . URI 08/10/2008   Qualifier: Diagnosis of  By: Radene Ou MD, [place]    . VAGINITIS, CANDIDAL 02/28/2010   Qualifier: Diagnosis of  By: Jorene Minors, Scott    . WEIGHT GAIN 06/15/2008   Qualifier: Diagnosis of  By: Radene Ou MD, [place]      Patient Active Problem List   Diagnosis Date Noted  . OBESITY 02/22/2011  . PHARYNGITIS 12/21/2010  . GUAIAC POSITIVE STOOL 10/06/2010  . VAGINITIS, CANDIDAL 02/28/2010  . SKIN TAG 12/19/2009  . ONYCHOMYCOSIS 10/26/2009  . BACK PAIN, LUMBAR 12/07/2008  . URI 08/10/2008  . WEIGHT GAIN 06/15/2008  . CELLULITIS AND ABSCESS OF OTHER SPECIFIED SITE 01/26/2008  . SEBACEOUS CYST, INFECTED 01/26/2008  . HEADACHE 11/05/2007  . DIABETES MELLITUS, TYPE II 08/26/2007  . HYPERLIPIDEMIA 08/26/2007  . HYPERTENSION 08/26/2007  . DIVERTICULITIS, HX OF 08/26/2007  .  COLECTOMY, PARTIAL, WITH ANASTOMOSIS, HX OF 08/26/2007    Past Surgical History:  Procedure Laterality Date  . ABDOMINAL HYSTERECTOMY    . BACK SURGERY    . BREAST LUMPECTOMY WITH RADIOACTIVE SEED LOCALIZATION Left 08/26/2018   Procedure: LEFT BREAST LUMPECTOMY WITH RADIOACTIVE SEED LOCALIZATION;  Surgeon: Erroll Luna, MD;  Location: [facility];  Service: General;  Laterality: Left;  . EYE SURGERY       OB History   No obstetric history on file.      Home Medications    Prior to Admission medications   Medication Sig Start Date End Date Taking? Authorizing Provider  acetaminophen (TYLENOL) 500 MG tablet Take 1 tablet (500 mg total) by mouth every 6 (six)  hours as needed. 02/21/19   Zigmund Gottron, NP  Cholecalciferol (VITAMIN D-3 PO) Take by mouth.    [provider]  gabapentin (NEURONTIN) 300 MG capsule Take 300 mg by mouth 3 (three) times daily. Take 300mg  every morning and 600mg  every night    [provider]  glimepiride (AMARYL) 4 MG tablet Take 4 mg by mouth 2 (two) times daily.    [provider]  hydrochlorothiazide (HYDRODIURIL) 25 MG tablet Take 25 mg by mouth daily.    [provider]  ibuprofen (ADVIL,MOTRIN) 800 MG tablet Take 1 tablet (800 mg total) by mouth every 8 (eight) hours as needed. 08/26/18   Erroll Luna, MD  IRON PO Take by mouth.    [provider]  lisinopril (PRINIVIL,ZESTRIL) 40 MG tablet TAKE ONE TABLET BY MOUTH EVERY DAY IN THE MORNING 08/16/11   Richardson Dopp T, PA-C  lovastatin (MEVACOR) 40 MG tablet Take 40 mg by mouth at bedtime.    [provider]  metFORMIN (GLUCOPHAGE) 1000 MG tablet TAKE ONE TABLET BY MOUTH TWICE DAILY 08/16/11   Richardson Dopp T, PA-C  METOPROLOL TARTRATE PO Take by mouth.    [provider]  sitaGLIPtin (JANUVIA) 100 MG tablet Take 100 mg by mouth daily.    [provider]  traMADol (ULTRAM) 50 MG tablet Take 1 tablet (50 mg total) by mouth every 8 (eight) hours as needed for severe pain. 02/21/19   Zigmund Gottron, NP  vitamin B-12 (CYANOCOBALAMIN) 1000 MCG tablet Take 1,000 mcg by mouth daily.    [provider]    Family History Family History  Problem Relation Age of Onset  . Cirrhosis Father   . Breast cancer Neg Hx     Social History Social History   Tobacco Use  . Smoking status: Former Research scientist (life sciences)  . Smokeless tobacco: Never Used  . Tobacco comment: quit 5 years ago  Substance Use Topics  . Alcohol use: No  . Drug use: Yes    Types: Marijuana     Allergies   Patient has no known allergies.   Review of Systems Review of Systems  Constitutional: Negative for fever.  HENT: Negative for  nosebleeds.   Eyes: Negative for pain and redness.  Respiratory: Negative for shortness of breath.   Cardiovascular: Negative for chest pain.  Gastrointestinal: Negative for abdominal pain, nausea and vomiting.  Genitourinary: Negative for flank pain.  Musculoskeletal: Negative for back pain and neck pain.  Skin: Negative for wound.  Neurological: Negative for weakness, numbness and headaches.  Hematological: Does not bruise/bleed easily.  Psychiatric/Behavioral: Negative for confusion.     Physical Exam Updated Vital Signs BP 139/84   Pulse 87   Temp 98.5 F (36.9 C) (Oral)  Resp 18   SpO2 95%   Physical Exam Vitals signs and nursing note reviewed.  Constitutional:      Appearance: Normal appearance. She is well-developed.  HENT:     Head: Atraumatic.     Nose: Nose normal.     Mouth/Throat:     Mouth: Mucous membranes are moist.  Eyes:     General: No scleral icterus.    Conjunctiva/sclera: Conjunctivae normal.     Pupils: Pupils are equal, round, and reactive to light.  Neck:     Musculoskeletal: Normal range of motion and neck supple.     Trachea: No tracheal deviation.  Cardiovascular:     Rate and Rhythm: Normal rate and regular rhythm.     Pulses: Normal pulses.  Pulmonary:     Effort: Pulmonary effort is normal. No respiratory distress.     Breath sounds: Normal breath sounds.  Chest:     Chest wall: No tenderness.  Abdominal:     General: There is no distension.     Tenderness: There is no abdominal tenderness.  Genitourinary:    Comments: No cva tenderness.  Musculoskeletal:     Comments: Tenderness left shoulder/proximal humerus. Skin intact. Compartments of arm are soft, not tense. Radial pulse 2+. No other focal bony tenderness noted on extremity exam. CTLS spine, non tender, aligned, no step off.   Skin:    General: Skin is warm and dry.     Findings: No rash.  Neurological:     Mental Status: She is alert.     Comments: Alert, speech normal.  Motor/sens grossly intact bil. LUE, rad/med/uln fxn, motor and sensory, intact.   Psychiatric:        Mood and Affect: Mood normal.      ED Treatments / Results  Labs (all labs ordered are listed, but only abnormal results are displayed) Labs Reviewed - No data to display  EKG None  Radiology Dg Forearm Left  Result Date: 02/21/2019 CLINICAL DATA:  Trauma and pain. EXAM: LEFT FOREARM - 2 VIEW COMPARISON:  Humerus films of earlier today. FINDINGS: No acute fracture or dislocation. Probable soft tissue swelling about the distal forearm. IMPRESSION: No acute osseous abnormality. Electronically Signed   By: Abigail Miyamoto M.D.   On: 02/21/2019 11:14   Dg Humerus Left  Result Date: 02/21/2019 CLINICAL DATA:  Left upper extremity pain post fall. EXAM: LEFT HUMERUS - 2+ VIEW COMPARISON:  None. FINDINGS: There is an impacted mildly comminuted nondisplaced sub capitellar fracture of the left humerus. IMPRESSION: Impacted mildly comminuted nondisplaced sub capitellar fracture of the left humerus. Electronically Signed   By: Fidela Salisbury M.D.   On: 02/21/2019 10:58    Procedures Procedures (including critical care time)  Medications Ordered in ED Medications  HYDROcodone-acetaminophen (NORCO/VICODIN) 5-325 MG per tablet 2 tablet (2 tablets Oral Given 02/23/19 1004)     Initial Impression / Assessment and Plan / ED Course  I have reviewed the triage vital signs and the nursing notes.  Pertinent labs & imaging results that were available during my care of the patient were reviewed by me and considered in my medical decision making (see chart for details).  Reviewed nursing notes and prior charts for additional history. Films from outside facility from 2 days ago reviewed - proximal humerus fx noted.  Pt is in sling.   Pt has not had pain medication today, requests med for pain. Hydrocodone po in ED - note that pt has ride, states she does not  have to drive.   Pt has rx ultram at  home. rec supplementing with nsaid, and will give rx robaxin for symptom relief.   Ortho f/u.   Pt appears stable for d/c.     Final Clinical Impressions(s) / ED Diagnoses   Final diagnoses:  None    ED Discharge Orders    None       Lajean Saver, MD 02/23/19 1027

## 2019-02-26 DIAGNOSIS — Z1389 Encounter for screening for other disorder: Secondary | ICD-10-CM | POA: Diagnosis not present

## 2019-02-26 DIAGNOSIS — R0609 Other forms of dyspnea: Secondary | ICD-10-CM | POA: Diagnosis not present

## 2019-02-26 DIAGNOSIS — I10 Essential (primary) hypertension: Secondary | ICD-10-CM | POA: Diagnosis not present

## 2019-02-26 DIAGNOSIS — E1169 Type 2 diabetes mellitus with other specified complication: Secondary | ICD-10-CM | POA: Diagnosis not present

## 2019-02-26 DIAGNOSIS — S42295D Other nondisplaced fracture of upper end of left humerus, subsequent encounter for fracture with routine healing: Secondary | ICD-10-CM | POA: Diagnosis not present

## 2019-02-26 DIAGNOSIS — J9859 Other diseases of mediastinum, not elsewhere classified: Secondary | ICD-10-CM | POA: Diagnosis not present

## 2019-02-26 DIAGNOSIS — Z0000 Encounter for general adult medical examination without abnormal findings: Secondary | ICD-10-CM | POA: Diagnosis not present

## 2019-02-26 DIAGNOSIS — E7800 Pure hypercholesterolemia, unspecified: Secondary | ICD-10-CM | POA: Diagnosis not present

## 2019-02-27 DIAGNOSIS — M25512 Pain in left shoulder: Secondary | ICD-10-CM | POA: Diagnosis not present

## 2019-03-02 ENCOUNTER — Telehealth (HOSPITAL_COMMUNITY): Payer: Self-pay | Admitting: *Deleted

## 2019-03-02 ENCOUNTER — Other Ambulatory Visit (HOSPITAL_COMMUNITY): Payer: Medicare HMO

## 2019-03-02 NOTE — Telephone Encounter (Signed)
Patient given detailed instructions per Myocardial Perfusion Study Information Sheet for the test on 03/04/19. Patient notified to arrive 15 minutes early and that it is imperative to arrive on time for appointment to keep from having the test rescheduled.  If you need to cancel or reschedule your appointment, please call the office within 24 hours of your appointment. . Patient verbalized understanding. Dana Dorner Jacqueline    

## 2019-03-03 ENCOUNTER — Encounter (HOSPITAL_COMMUNITY): Payer: Medicare HMO

## 2019-03-04 ENCOUNTER — Ambulatory Visit (HOSPITAL_COMMUNITY): Payer: Medicare HMO

## 2019-03-04 ENCOUNTER — Encounter (HOSPITAL_COMMUNITY): Payer: Self-pay | Admitting: *Deleted

## 2019-03-04 ENCOUNTER — Encounter (HOSPITAL_COMMUNITY): Payer: Self-pay

## 2019-03-04 NOTE — Progress Notes (Signed)
Jessica Jacobs presented for echocardiogram and nuclear stress this morning. We noted she has broken her left arm and cannot lift her arm up for neither echo nor stress tests. Dr. Johnsie Cancel was notified and he advised to have the exams rescheduled for 8 weeks.  Wyatt Mage, RDCS

## 2019-03-10 DIAGNOSIS — S42295A Other nondisplaced fracture of upper end of left humerus, initial encounter for closed fracture: Secondary | ICD-10-CM | POA: Diagnosis not present

## 2019-03-10 DIAGNOSIS — M25512 Pain in left shoulder: Secondary | ICD-10-CM | POA: Diagnosis not present

## 2019-03-12 DIAGNOSIS — H401111 Primary open-angle glaucoma, right eye, mild stage: Secondary | ICD-10-CM | POA: Diagnosis not present

## 2019-03-12 DIAGNOSIS — H2511 Age-related nuclear cataract, right eye: Secondary | ICD-10-CM | POA: Diagnosis not present

## 2019-03-12 DIAGNOSIS — Z961 Presence of intraocular lens: Secondary | ICD-10-CM | POA: Diagnosis not present

## 2019-03-12 DIAGNOSIS — H524 Presbyopia: Secondary | ICD-10-CM | POA: Diagnosis not present

## 2019-03-12 DIAGNOSIS — H5202 Hypermetropia, left eye: Secondary | ICD-10-CM | POA: Diagnosis not present

## 2019-03-12 DIAGNOSIS — H52223 Regular astigmatism, bilateral: Secondary | ICD-10-CM | POA: Diagnosis not present

## 2019-03-13 DIAGNOSIS — S42295A Other nondisplaced fracture of upper end of left humerus, initial encounter for closed fracture: Secondary | ICD-10-CM | POA: Diagnosis not present

## 2019-03-16 DIAGNOSIS — S42295A Other nondisplaced fracture of upper end of left humerus, initial encounter for closed fracture: Secondary | ICD-10-CM | POA: Diagnosis not present

## 2019-03-18 DIAGNOSIS — S42295A Other nondisplaced fracture of upper end of left humerus, initial encounter for closed fracture: Secondary | ICD-10-CM | POA: Diagnosis not present

## 2019-03-20 DIAGNOSIS — S42295A Other nondisplaced fracture of upper end of left humerus, initial encounter for closed fracture: Secondary | ICD-10-CM | POA: Diagnosis not present

## 2019-03-24 ENCOUNTER — Telehealth: Payer: Self-pay

## 2019-03-24 NOTE — Telephone Encounter (Signed)
New message   Spoke with patient per staff messages from Dr. Radford Pax echocardiogram and myocardial perfusion has been rescheduled to 06/03/2019.

## 2019-03-31 ENCOUNTER — Other Ambulatory Visit: Payer: Self-pay | Admitting: Cardiothoracic Surgery

## 2019-03-31 DIAGNOSIS — E328 Other diseases of thymus: Secondary | ICD-10-CM

## 2019-04-07 DIAGNOSIS — S42295D Other nondisplaced fracture of upper end of left humerus, subsequent encounter for fracture with routine healing: Secondary | ICD-10-CM | POA: Diagnosis not present

## 2019-04-08 DIAGNOSIS — M25612 Stiffness of left shoulder, not elsewhere classified: Secondary | ICD-10-CM | POA: Diagnosis not present

## 2019-04-08 DIAGNOSIS — M25512 Pain in left shoulder: Secondary | ICD-10-CM | POA: Diagnosis not present

## 2019-04-08 DIAGNOSIS — S42255D Nondisplaced fracture of greater tuberosity of left humerus, subsequent encounter for fracture with routine healing: Secondary | ICD-10-CM | POA: Diagnosis not present

## 2019-04-08 DIAGNOSIS — M6281 Muscle weakness (generalized): Secondary | ICD-10-CM | POA: Diagnosis not present

## 2019-04-09 DIAGNOSIS — D649 Anemia, unspecified: Secondary | ICD-10-CM | POA: Diagnosis not present

## 2019-04-13 DIAGNOSIS — M25612 Stiffness of left shoulder, not elsewhere classified: Secondary | ICD-10-CM | POA: Diagnosis not present

## 2019-04-13 DIAGNOSIS — M6281 Muscle weakness (generalized): Secondary | ICD-10-CM | POA: Diagnosis not present

## 2019-04-13 DIAGNOSIS — M25512 Pain in left shoulder: Secondary | ICD-10-CM | POA: Diagnosis not present

## 2019-04-13 DIAGNOSIS — S42255D Nondisplaced fracture of greater tuberosity of left humerus, subsequent encounter for fracture with routine healing: Secondary | ICD-10-CM | POA: Diagnosis not present

## 2019-04-15 DIAGNOSIS — M25612 Stiffness of left shoulder, not elsewhere classified: Secondary | ICD-10-CM | POA: Diagnosis not present

## 2019-04-15 DIAGNOSIS — M6281 Muscle weakness (generalized): Secondary | ICD-10-CM | POA: Diagnosis not present

## 2019-04-15 DIAGNOSIS — S42255D Nondisplaced fracture of greater tuberosity of left humerus, subsequent encounter for fracture with routine healing: Secondary | ICD-10-CM | POA: Diagnosis not present

## 2019-04-15 DIAGNOSIS — M25512 Pain in left shoulder: Secondary | ICD-10-CM | POA: Diagnosis not present

## 2019-04-17 DIAGNOSIS — M25512 Pain in left shoulder: Secondary | ICD-10-CM | POA: Diagnosis not present

## 2019-04-17 DIAGNOSIS — S42255D Nondisplaced fracture of greater tuberosity of left humerus, subsequent encounter for fracture with routine healing: Secondary | ICD-10-CM | POA: Diagnosis not present

## 2019-04-17 DIAGNOSIS — M6281 Muscle weakness (generalized): Secondary | ICD-10-CM | POA: Diagnosis not present

## 2019-04-17 DIAGNOSIS — M25612 Stiffness of left shoulder, not elsewhere classified: Secondary | ICD-10-CM | POA: Diagnosis not present

## 2019-04-20 DIAGNOSIS — M25612 Stiffness of left shoulder, not elsewhere classified: Secondary | ICD-10-CM | POA: Diagnosis not present

## 2019-04-20 DIAGNOSIS — R293 Abnormal posture: Secondary | ICD-10-CM | POA: Diagnosis not present

## 2019-04-20 DIAGNOSIS — M25512 Pain in left shoulder: Secondary | ICD-10-CM | POA: Diagnosis not present

## 2019-04-20 DIAGNOSIS — M6281 Muscle weakness (generalized): Secondary | ICD-10-CM | POA: Diagnosis not present

## 2019-04-20 DIAGNOSIS — S42255D Nondisplaced fracture of greater tuberosity of left humerus, subsequent encounter for fracture with routine healing: Secondary | ICD-10-CM | POA: Diagnosis not present

## 2019-04-22 ENCOUNTER — Ambulatory Visit (HOSPITAL_COMMUNITY): Payer: Medicare HMO

## 2019-04-22 ENCOUNTER — Other Ambulatory Visit (HOSPITAL_COMMUNITY): Payer: Medicare HMO

## 2019-04-22 DIAGNOSIS — S42255D Nondisplaced fracture of greater tuberosity of left humerus, subsequent encounter for fracture with routine healing: Secondary | ICD-10-CM | POA: Diagnosis not present

## 2019-04-22 DIAGNOSIS — R293 Abnormal posture: Secondary | ICD-10-CM | POA: Diagnosis not present

## 2019-04-22 DIAGNOSIS — M6281 Muscle weakness (generalized): Secondary | ICD-10-CM | POA: Diagnosis not present

## 2019-04-22 DIAGNOSIS — M25512 Pain in left shoulder: Secondary | ICD-10-CM | POA: Diagnosis not present

## 2019-04-22 DIAGNOSIS — M25612 Stiffness of left shoulder, not elsewhere classified: Secondary | ICD-10-CM | POA: Diagnosis not present

## 2019-04-23 ENCOUNTER — Ambulatory Visit (HOSPITAL_COMMUNITY): Payer: Medicare HMO

## 2019-04-24 DIAGNOSIS — M6281 Muscle weakness (generalized): Secondary | ICD-10-CM | POA: Diagnosis not present

## 2019-04-24 DIAGNOSIS — M25512 Pain in left shoulder: Secondary | ICD-10-CM | POA: Diagnosis not present

## 2019-04-24 DIAGNOSIS — M25612 Stiffness of left shoulder, not elsewhere classified: Secondary | ICD-10-CM | POA: Diagnosis not present

## 2019-04-24 DIAGNOSIS — R293 Abnormal posture: Secondary | ICD-10-CM | POA: Diagnosis not present

## 2019-04-24 DIAGNOSIS — S42255D Nondisplaced fracture of greater tuberosity of left humerus, subsequent encounter for fracture with routine healing: Secondary | ICD-10-CM | POA: Diagnosis not present

## 2019-04-27 DIAGNOSIS — M25512 Pain in left shoulder: Secondary | ICD-10-CM | POA: Diagnosis not present

## 2019-04-27 DIAGNOSIS — M6281 Muscle weakness (generalized): Secondary | ICD-10-CM | POA: Diagnosis not present

## 2019-04-27 DIAGNOSIS — R293 Abnormal posture: Secondary | ICD-10-CM | POA: Diagnosis not present

## 2019-04-27 DIAGNOSIS — S42255D Nondisplaced fracture of greater tuberosity of left humerus, subsequent encounter for fracture with routine healing: Secondary | ICD-10-CM | POA: Diagnosis not present

## 2019-04-27 DIAGNOSIS — M25612 Stiffness of left shoulder, not elsewhere classified: Secondary | ICD-10-CM | POA: Diagnosis not present

## 2019-05-01 DIAGNOSIS — R293 Abnormal posture: Secondary | ICD-10-CM | POA: Diagnosis not present

## 2019-05-01 DIAGNOSIS — M6281 Muscle weakness (generalized): Secondary | ICD-10-CM | POA: Diagnosis not present

## 2019-05-01 DIAGNOSIS — M25612 Stiffness of left shoulder, not elsewhere classified: Secondary | ICD-10-CM | POA: Diagnosis not present

## 2019-05-01 DIAGNOSIS — M25512 Pain in left shoulder: Secondary | ICD-10-CM | POA: Diagnosis not present

## 2019-05-01 DIAGNOSIS — S42255D Nondisplaced fracture of greater tuberosity of left humerus, subsequent encounter for fracture with routine healing: Secondary | ICD-10-CM | POA: Diagnosis not present

## 2019-05-05 DIAGNOSIS — M6281 Muscle weakness (generalized): Secondary | ICD-10-CM | POA: Diagnosis not present

## 2019-05-05 DIAGNOSIS — M25612 Stiffness of left shoulder, not elsewhere classified: Secondary | ICD-10-CM | POA: Diagnosis not present

## 2019-05-05 DIAGNOSIS — M25512 Pain in left shoulder: Secondary | ICD-10-CM | POA: Diagnosis not present

## 2019-05-05 DIAGNOSIS — R293 Abnormal posture: Secondary | ICD-10-CM | POA: Diagnosis not present

## 2019-05-05 DIAGNOSIS — S42255D Nondisplaced fracture of greater tuberosity of left humerus, subsequent encounter for fracture with routine healing: Secondary | ICD-10-CM | POA: Diagnosis not present

## 2019-05-08 DIAGNOSIS — M6281 Muscle weakness (generalized): Secondary | ICD-10-CM | POA: Diagnosis not present

## 2019-05-08 DIAGNOSIS — M25512 Pain in left shoulder: Secondary | ICD-10-CM | POA: Diagnosis not present

## 2019-05-08 DIAGNOSIS — R293 Abnormal posture: Secondary | ICD-10-CM | POA: Diagnosis not present

## 2019-05-08 DIAGNOSIS — M25612 Stiffness of left shoulder, not elsewhere classified: Secondary | ICD-10-CM | POA: Diagnosis not present

## 2019-05-08 DIAGNOSIS — S42255D Nondisplaced fracture of greater tuberosity of left humerus, subsequent encounter for fracture with routine healing: Secondary | ICD-10-CM | POA: Diagnosis not present

## 2019-05-14 DIAGNOSIS — S42255D Nondisplaced fracture of greater tuberosity of left humerus, subsequent encounter for fracture with routine healing: Secondary | ICD-10-CM | POA: Diagnosis not present

## 2019-05-14 DIAGNOSIS — M25512 Pain in left shoulder: Secondary | ICD-10-CM | POA: Diagnosis not present

## 2019-05-14 DIAGNOSIS — M25612 Stiffness of left shoulder, not elsewhere classified: Secondary | ICD-10-CM | POA: Diagnosis not present

## 2019-05-14 DIAGNOSIS — M6281 Muscle weakness (generalized): Secondary | ICD-10-CM | POA: Diagnosis not present

## 2019-05-14 DIAGNOSIS — R293 Abnormal posture: Secondary | ICD-10-CM | POA: Diagnosis not present

## 2019-05-15 DIAGNOSIS — S42255D Nondisplaced fracture of greater tuberosity of left humerus, subsequent encounter for fracture with routine healing: Secondary | ICD-10-CM | POA: Diagnosis not present

## 2019-05-15 DIAGNOSIS — M25512 Pain in left shoulder: Secondary | ICD-10-CM | POA: Diagnosis not present

## 2019-05-15 DIAGNOSIS — M6281 Muscle weakness (generalized): Secondary | ICD-10-CM | POA: Diagnosis not present

## 2019-05-15 DIAGNOSIS — M25612 Stiffness of left shoulder, not elsewhere classified: Secondary | ICD-10-CM | POA: Diagnosis not present

## 2019-05-15 DIAGNOSIS — R293 Abnormal posture: Secondary | ICD-10-CM | POA: Diagnosis not present

## 2019-05-19 DIAGNOSIS — S42295D Other nondisplaced fracture of upper end of left humerus, subsequent encounter for fracture with routine healing: Secondary | ICD-10-CM | POA: Diagnosis not present

## 2019-05-22 DIAGNOSIS — M25512 Pain in left shoulder: Secondary | ICD-10-CM | POA: Diagnosis not present

## 2019-05-22 DIAGNOSIS — M6281 Muscle weakness (generalized): Secondary | ICD-10-CM | POA: Diagnosis not present

## 2019-05-22 DIAGNOSIS — S42255D Nondisplaced fracture of greater tuberosity of left humerus, subsequent encounter for fracture with routine healing: Secondary | ICD-10-CM | POA: Diagnosis not present

## 2019-05-22 DIAGNOSIS — R293 Abnormal posture: Secondary | ICD-10-CM | POA: Diagnosis not present

## 2019-05-22 DIAGNOSIS — M25612 Stiffness of left shoulder, not elsewhere classified: Secondary | ICD-10-CM | POA: Diagnosis not present

## 2019-05-28 DIAGNOSIS — M25612 Stiffness of left shoulder, not elsewhere classified: Secondary | ICD-10-CM | POA: Diagnosis not present

## 2019-05-28 DIAGNOSIS — S42255D Nondisplaced fracture of greater tuberosity of left humerus, subsequent encounter for fracture with routine healing: Secondary | ICD-10-CM | POA: Diagnosis not present

## 2019-05-28 DIAGNOSIS — M25512 Pain in left shoulder: Secondary | ICD-10-CM | POA: Diagnosis not present

## 2019-05-28 DIAGNOSIS — M6281 Muscle weakness (generalized): Secondary | ICD-10-CM | POA: Diagnosis not present

## 2019-05-28 DIAGNOSIS — R293 Abnormal posture: Secondary | ICD-10-CM | POA: Diagnosis not present

## 2019-06-02 ENCOUNTER — Telehealth (HOSPITAL_COMMUNITY): Payer: Self-pay | Admitting: *Deleted

## 2019-06-02 ENCOUNTER — Telehealth (HOSPITAL_COMMUNITY): Payer: Self-pay | Admitting: Cardiology

## 2019-06-02 DIAGNOSIS — M25612 Stiffness of left shoulder, not elsewhere classified: Secondary | ICD-10-CM | POA: Diagnosis not present

## 2019-06-02 DIAGNOSIS — M25512 Pain in left shoulder: Secondary | ICD-10-CM | POA: Diagnosis not present

## 2019-06-02 DIAGNOSIS — S42255D Nondisplaced fracture of greater tuberosity of left humerus, subsequent encounter for fracture with routine healing: Secondary | ICD-10-CM | POA: Diagnosis not present

## 2019-06-02 DIAGNOSIS — M6281 Muscle weakness (generalized): Secondary | ICD-10-CM | POA: Diagnosis not present

## 2019-06-02 DIAGNOSIS — R293 Abnormal posture: Secondary | ICD-10-CM | POA: Diagnosis not present

## 2019-06-02 NOTE — Telephone Encounter (Signed)

## 2019-06-02 NOTE — Telephone Encounter (Signed)
Patient given detailed instructions per Myocardial Perfusion Study Information Sheet for the test on 06/03/19 at 10:30. Patient notified to arrive 15 minutes early and that it is imperative to arrive on time for appointment to keep from having the test rescheduled.  If you need to cancel or reschedule your appointment, please call the office within 24 hours of your appointment. . Patient verbalized understanding.Jessica Jacobs

## 2019-06-03 ENCOUNTER — Ambulatory Visit (HOSPITAL_BASED_OUTPATIENT_CLINIC_OR_DEPARTMENT_OTHER): Payer: Medicare HMO

## 2019-06-03 ENCOUNTER — Ambulatory Visit (HOSPITAL_COMMUNITY): Payer: Medicare HMO | Attending: Cardiology

## 2019-06-03 ENCOUNTER — Other Ambulatory Visit: Payer: Self-pay

## 2019-06-03 DIAGNOSIS — E785 Hyperlipidemia, unspecified: Secondary | ICD-10-CM | POA: Diagnosis not present

## 2019-06-03 DIAGNOSIS — Z01818 Encounter for other preprocedural examination: Secondary | ICD-10-CM

## 2019-06-03 DIAGNOSIS — R110 Nausea: Secondary | ICD-10-CM | POA: Insufficient documentation

## 2019-06-03 DIAGNOSIS — I10 Essential (primary) hypertension: Secondary | ICD-10-CM | POA: Diagnosis not present

## 2019-06-03 DIAGNOSIS — R9431 Abnormal electrocardiogram [ECG] [EKG]: Secondary | ICD-10-CM

## 2019-06-03 LAB — MYOCARDIAL PERFUSION IMAGING
CHL CUP NUCLEAR SRS: 0
CSEPPHR: 88 {beats}/min
LV dias vol: 66 mL (ref 46–106)
LV sys vol: 20 mL
Rest HR: 60 {beats}/min
SDS: 0
SSS: 0
TID: 1.01

## 2019-06-03 MED ORDER — TECHNETIUM TC 99M TETROFOSMIN IV KIT
10.3000 | PACK | Freq: Once | INTRAVENOUS | Status: AC | PRN
  Administered 2019-06-03: 10.3 via INTRAVENOUS
  Filled 2019-06-03: qty 11

## 2019-06-03 MED ORDER — TECHNETIUM TC 99M TETROFOSMIN IV KIT
32.6000 | PACK | Freq: Once | INTRAVENOUS | Status: AC | PRN
  Administered 2019-06-03: 32.6 via INTRAVENOUS
  Filled 2019-06-03: qty 33

## 2019-06-03 MED ORDER — AMINOPHYLLINE 25 MG/ML IV SOLN
75.0000 mg | Freq: Once | INTRAVENOUS | Status: AC
  Administered 2019-06-03: 75 mg via INTRAVENOUS

## 2019-06-03 MED ORDER — REGADENOSON 0.4 MG/5ML IV SOLN
0.4000 mg | Freq: Once | INTRAVENOUS | Status: AC
  Administered 2019-06-03: 0.4 mg via INTRAVENOUS

## 2019-06-04 DIAGNOSIS — M25512 Pain in left shoulder: Secondary | ICD-10-CM | POA: Diagnosis not present

## 2019-06-04 DIAGNOSIS — M25612 Stiffness of left shoulder, not elsewhere classified: Secondary | ICD-10-CM | POA: Diagnosis not present

## 2019-06-04 DIAGNOSIS — M6281 Muscle weakness (generalized): Secondary | ICD-10-CM | POA: Diagnosis not present

## 2019-06-04 DIAGNOSIS — R293 Abnormal posture: Secondary | ICD-10-CM | POA: Diagnosis not present

## 2019-06-04 DIAGNOSIS — S42255D Nondisplaced fracture of greater tuberosity of left humerus, subsequent encounter for fracture with routine healing: Secondary | ICD-10-CM | POA: Diagnosis not present

## 2019-06-05 ENCOUNTER — Telehealth: Payer: Self-pay | Admitting: Cardiovascular Disease

## 2019-06-05 NOTE — Telephone Encounter (Signed)
Called patient with results of echo and stress test.

## 2019-06-05 NOTE — Telephone Encounter (Signed)
New message   Patient is returning call for echo results. Please call. 

## 2019-06-23 DIAGNOSIS — E1169 Type 2 diabetes mellitus with other specified complication: Secondary | ICD-10-CM | POA: Diagnosis not present

## 2019-06-23 DIAGNOSIS — I10 Essential (primary) hypertension: Secondary | ICD-10-CM | POA: Diagnosis not present

## 2019-06-23 DIAGNOSIS — I700 Atherosclerosis of aorta: Secondary | ICD-10-CM | POA: Diagnosis not present

## 2019-06-23 DIAGNOSIS — E7800 Pure hypercholesterolemia, unspecified: Secondary | ICD-10-CM | POA: Diagnosis not present

## 2019-06-24 ENCOUNTER — Other Ambulatory Visit: Payer: Self-pay

## 2019-06-24 DIAGNOSIS — E7800 Pure hypercholesterolemia, unspecified: Secondary | ICD-10-CM | POA: Diagnosis not present

## 2019-06-24 DIAGNOSIS — I700 Atherosclerosis of aorta: Secondary | ICD-10-CM | POA: Diagnosis not present

## 2019-06-24 DIAGNOSIS — E1169 Type 2 diabetes mellitus with other specified complication: Secondary | ICD-10-CM | POA: Diagnosis not present

## 2019-06-24 DIAGNOSIS — I10 Essential (primary) hypertension: Secondary | ICD-10-CM | POA: Diagnosis not present

## 2019-06-25 ENCOUNTER — Ambulatory Visit (INDEPENDENT_AMBULATORY_CARE_PROVIDER_SITE_OTHER): Payer: Medicare HMO | Admitting: Cardiothoracic Surgery

## 2019-06-25 ENCOUNTER — Ambulatory Visit
Admission: RE | Admit: 2019-06-25 | Discharge: 2019-06-25 | Disposition: A | Discharge status: Home / Self Care | Payer: Medicare HMO | Source: Ambulatory Visit | Attending: Cardiothoracic Surgery | Admitting: Cardiothoracic Surgery

## 2019-06-25 VITALS — BP 145/84 | HR 62 | Temp 97.5°F | Resp 20 | Ht 65.0 in | Wt 188.0 lb

## 2019-06-25 DIAGNOSIS — E328 Other diseases of thymus: Secondary | ICD-10-CM

## 2019-06-25 DIAGNOSIS — J9859 Other diseases of mediastinum, not elsewhere classified: Secondary | ICD-10-CM | POA: Diagnosis not present

## 2019-06-25 NOTE — Progress Notes (Signed)
[address]       [place] [address]             [phone]                    [name] [place] Medical Record #[id number] Date of Birth: [birth year]  Referring: Seward Carol, MD Primary Care: Seward Carol, MD Primary Cardiologist: No primary care provider on file.  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 female i was seen in the office 6 months ago after abnormal chest xray lead to ct of chest And PET scan.  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   The patient returns today with a follow-up CT scan, somewhat delayed from the [place] pandemic.  The patient says she has had no signs or symptoms of chest discomfort or chest wall pain.   Current Activity/ Functional Status:  Patient is independent with mobility/ambulation, transfers, ADL's, IADL's.   Zubrod Score: At the time of surgery this patients most appropriate activity status/level should be described as: []     0    Normal activity, no symptoms [x]     1    Restricted in physical strenuous activity but ambulatory, able to do out light work []     2    Ambulatory and capable of self care, unable to do work activities, up and about               >50 % of waking hours                              []     3    Only limited self care, in bed greater than 50% of waking hours []     4    Completely disabled, no self care, confined to bed or chair []     5    Moribund   Past Medical History:  Diagnosis Date   Anemia    BACK PAIN, LUMBAR 12/07/2008   Qualifier: Diagnosis of  By: Radene Ou MD, [place]     Cellulitis and abscess of other specified site 01/26/2008   Qualifier: Diagnosis of  By: Rankins MD, Octavia Bruckner, PARTIAL, WITH ANASTOMOSIS, HX OF 08/26/2007   Qualifier: Diagnosis  of  By: Radene Ou MD, [place]     Diabetes mellitus without complication ([place])    DIABETES MELLITUS, TYPE II 08/26/2007   Qualifier: Diagnosis of  By: Radene Ou MD, Loman Chroman, HX OF 08/26/2007   Qualifier: Diagnosis of  By: Radene Ou MD, Champ Mungo POSITIVE STOOL 10/06/2010   Annotation: Dr. Velora Heckler rec 10.2011:  repeat hemoccults; if + and anemic get  EGD Qualifier: Diagnosis of  By: Jorene Minors, Scott     HEADACHE 11/05/2007   Qualifier: Diagnosis of  By: Radene Ou MD, Rexanne Mano 08/26/2007   Qualifier: Diagnosis of  By: Radene Ou MD, [place]     Hypertension    OBESITY 02/22/2011   Qualifier: Diagnosis of  By: Hassell Done FNP, Tori Milks     ONYCHOMYCOSIS 10/26/2009   Qualifier: Diagnosis of  By: Kathlen Mody  PA-C, Scott     PHARYNGITIS 12/21/2010   Qualifier: Diagnosis of  By: Hassell Done FNP, [place]     SEBACEOUS CYST, INFECTED 01/26/2008   Qualifier: Diagnosis of  By: Radene Ou MD, [place]     SKIN TAG 12/19/2009   Qualifier: Diagnosis of  By: Jorene Minors, Scott     URI 08/10/2008   Qualifier: Diagnosis of  By: Radene Ou MD, Amaryllis Dyke, CANDIDAL 02/28/2010   Qualifier: Diagnosis of  By: Jorene Minors, Scott     WEIGHT GAIN 06/15/2008   Qualifier: Diagnosis of  By: Radene Ou MD, [place]      Past Surgical History:  Procedure Laterality Date   ABDOMINAL HYSTERECTOMY     BACK SURGERY     BREAST LUMPECTOMY WITH RADIOACTIVE SEED LOCALIZATION Left 08/26/2018   Procedure: LEFT BREAST LUMPECTOMY WITH RADIOACTIVE SEED LOCALIZATION;  Surgeon: Erroll Luna, MD;  Location: [facility];  Service: General;  Laterality: Left;   EYE SURGERY      Family History  Problem Relation Age of Onset   Cirrhosis Father    Breast cancer Neg Hx      Social History   Tobacco Use  Smoking Status Former Smoker  Smokeless Tobacco Never Used  Tobacco Comment   quit 5 years ago    Social History   Substance and Sexual Activity  Alcohol  Use No     No Known Allergies  Current Outpatient Medications  Medication Sig Dispense Refill   acetaminophen (TYLENOL) 500 MG tablet Take 1 tablet (500 mg total) by mouth every 6 (six) hours as needed. 30 tablet 0   Cholecalciferol (VITAMIN D-3 PO) Take by mouth.     gabapentin (NEURONTIN) 300 MG capsule Take 300 mg by mouth 3 (three) times daily. Take 300mg  every morning and 600mg  every night     glimepiride (AMARYL) 4 MG tablet Take 4 mg by mouth 2 (two) times daily.     hydrochlorothiazide (HYDRODIURIL) 25 MG tablet Take 25 mg by mouth daily.     ibuprofen (ADVIL,MOTRIN) 800 MG tablet Take 1 tablet (800 mg total) by mouth every 8 (eight) hours as needed. 30 tablet 0   IRON PO Take by mouth.     lisinopril (PRINIVIL,ZESTRIL) 40 MG tablet TAKE ONE TABLET BY MOUTH EVERY DAY IN THE MORNING 30 tablet 6   lovastatin (MEVACOR) 40 MG tablet Take 40 mg by mouth at bedtime.     metFORMIN (GLUCOPHAGE) 1000 MG tablet TAKE ONE TABLET BY MOUTH TWICE DAILY 60 tablet 1   methocarbamol (ROBAXIN) 750 MG tablet Take 1 tablet (750 mg total) by mouth every 8 (eight) hours as needed (muscle spasm/pain). 15 tablet 0   METOPROLOL TARTRATE PO Take by mouth.     sitaGLIPtin (JANUVIA) 100 MG tablet Take 100 mg by mouth daily.     traMADol (ULTRAM) 50 MG tablet Take 1 tablet (50 mg total) by mouth every 8 (eight) hours as needed for severe pain. 15 tablet 0   vitamin B-12 (CYANOCOBALAMIN) 1000 MCG tablet Take 1,000 mcg by mouth daily.     No current facility-administered medications for this visit.     Pertinent items are noted in HPI.   Review of Systems:     Cardiac Review of Systems: [Y] = yes  or   [ N ] = no   Chest Pain [ n   ]  Resting SOB [ n  ] Exertional SOB  [n  ]  Orthopnea [ n ]  Pedal Edema [ n  ]    Palpitations [  n] Syncope  [n  ]   Presyncope [n   ]   General Review of Systems: [Y] = yes [  ]=no Constitional: recent weight change [  ];  Wt loss over the last 3 months [    ] anorexia [  ]; fatigue [  ]; nausea [  ]; night sweats [  ]; fever [  ]; or chills [  ];           Eye : blurred vision [  ]; diplopia [   ]; vision changes [  ];  Amaurosis fugax[  ]; Resp: cough [  ];  wheezing[  ];  hemoptysis[  ]; shortness of breath[  ]; paroxysmal nocturnal dyspnea[  ]; dyspnea on exertion[  ]; or orthopnea[  ];  GI:  gallstones[  ], vomiting[  ];  dysphagia[  ]; melena[  ];  hematochezia [  ]; heartburn[  ];   Hx of  Colonoscopy[  ]; GU: kidney stones [  ]; hematuria[  ];   dysuria [  ];  nocturia[  ];  history of     obstruction [  ]; urinary frequency [  ]             Skin: rash, swelling[  ];, hair loss[  ];  peripheral edema[  ];  or itching[  ]; Musculosketetal: myalgias[  ];  joint swelling[  ];  joint erythema[  ];  joint pain[  ];  back pain[  ];  Heme/Lymph: bruising[  ];  bleeding[  ];  anemia[  ];  Neuro: TIA[  ];  headaches[  ];  stroke[  ];  vertigo[  ];  seizures[  ];   paresthesias[  ];  difficulty walking[  ];  Psych:depression[  ]; anxiety[  ];  Endocrine: diabetes[  ];  thyroid dysfunction[  ];  Immunizations: Flu up to date [  ]; Pneumococcal up to date [  ];  Other:     PHYSICAL EXAMINATION: Ht 5\' 5"  (1.651 m)    BMI 31.95 kg/m  General appearance: alert, cooperative and no distress Head: Normocephalic, without obvious abnormality, atraumatic Neck: no adenopathy, no carotid bruit, no JVD, supple, symmetrical, trachea midline and thyroid not enlarged, symmetric, no tenderness/mass/nodules Lymph nodes: Cervical, supraclavicular, and axillary nodes normal. Resp: clear to auscultation bilaterally Back: symmetric, no curvature. ROM normal. No CVA tenderness. Cardio: regular rate and rhythm, S1, S2 normal, no murmur, click, rub or gallop GI: soft, non-tender; bowel sounds normal; no masses,  no organomegaly Extremities: extremities normal, atraumatic, no cyanosis or edema and Homans sign is negative, no sign of DVT Neurologic: Grossly  normal  Diagnostic Studies & Laboratory data:     Recent Radiology Findings:   Ct Chest Wo Contrast  Result Date: 06/25/2019 CLINICAL DATA:  Follow-up thymic cyst. EXAM: CT CHEST WITHOUT CONTRAST TECHNIQUE: Multidetector CT imaging of the chest was performed following the standard protocol without IV contrast. COMPARISON:  02/09/2019 FINDINGS: Cardiovascular: Mild cardiac enlargement. Aortic atherosclerosis. The LAD, left circumflex, left main and RCA coronary artery calcifications. Mediastinum/Nodes: Normal appearance of the thyroid gland. The trachea appears patent and is midline. Normal appearance of the esophagus. Anterior mediastinal fluid attenuating mass is again noted. This measures 6.2 x 3.5 by 9.7 cm (volume = 110 cm^3). Previously this measured 6.0 by 3.2 x 9.7 cm (volume = 98 cm^3). No mediastinal or hilar adenopathy identified. Lungs/Pleura: No pleural effusion. No  airspace consolidation or atelectasis. Right upper lobe subpleural chest wall mass extending through the intercostal space is again noted it is favored to represent a lipoma. Unchanged. Stable 3 mm lingular nodule, image 65/8. 4 mm right lower lobe lung nodule unchanged, image 84/8. Upper Abdomen: No acute abnormality. Musculoskeletal: No aggressive lytic or sclerotic bone lesions. IMPRESSION: 1. Cystic lesion within the anterior mediastinum is stable to slightly increased in volume in the interval. Currently this has an approximate volume of 110 cc compared with 98 cc previously. 2. Similar appearance subpleural right chest wall fat density lesion which extends through the intercostal space. 3. Scattered tiny pulmonary nodules are nonspecific but appear unchanged from previous exam. 4. Aortic Atherosclerosis (ICD10-I70.0). Coronary artery calcifications. Electronically Signed   By: Kerby Moors M.D.   On: 06/25/2019 09:50  I have independently reviewed the above radiology studies  and reviewed the findings with the  patient.     Ct Chest Wo Contrast  Result Date: 01/27/2019 CLINICAL DATA:  Lung nodule EXAM: CT CHEST WITHOUT CONTRAST TECHNIQUE: Multidetector CT imaging of the chest was performed following the standard protocol without IV contrast. COMPARISON:  Chest x-ray from 10 days ago FINDINGS: Cardiovascular: Normal heart size. No pericardial effusion. Fat within the right and left ventricles without apparent wall thickening or discrete appearance, not convincingly from prior infarct. There is aortic and coronary atherosclerosis. Mediastinum/Nodes: Lobulated anterior mediastinal mass measuring 10 x 5.5 x 3.2 cm. This is along the fatty thymus and is homogeneous. No hilar or middle mediastinal adenopathy. No IMA adenopathy. Lungs/Pleura: Mild airway thickening and minimal centrilobular emphysema. Crescentic right upper lobe opacity along a fatty mass spanning the for intercostal space. The mass has simple fatty internal architecture and measures 4.4 x 2.2 cm on axial slices. The pulmonary opacity is presumably scarring. There are a few subpleural flat nodules which likely reflect lymph nodes. Anticipate CT follow-up. Upper Abdomen: Negative Musculoskeletal: No acute or aggressive finding IMPRESSION: 1. 10 x 5.5 x 3.2 cm anterior mediastinal mass, favor thymic neoplasm over lymphoma. No pleural effusion or soft tissue pleural nodularity 2. The right-sided lung opacity on prior chest x-ray is related to a lipoma traversing the fourth intercostal space, seemingly unrelated to #1. Electronically Signed   By: Monte Fantasia M.D.   On: 01/27/2019 05:13   Nm Pet Image Initial (pi) Skull Base To Thigh  Result Date: 02/09/2019 CLINICAL DATA:  Initial treatment strategy for mediastinal mass. EXAM: NUCLEAR MEDICINE PET SKULL BASE TO THIGH TECHNIQUE: 9.4 mCi F-18 FDG was injected intravenously. Full-ring PET imaging was performed from the skull base to thigh after the radiotracer. CT data was obtained and used for attenuation  correction and anatomic localization. Fasting blood glucose: 87 mg/dl COMPARISON:  CT chest from 01/26/2019 FINDINGS: Mediastinal blood pool activity: SUV max 2.9 NECK: Physiologic appearing activity along the tonsils with minimal right eccentricity but without significant CT correlate. Incidental CT findings: none CHEST: The hypodense anterior mediastinal structure has near fluid density and is photopenic. This primarily fatty mass of the right chest wall deep to the serratus anterior which bulges in between the fourth and fifth ribs into the thoracic cavity, with some adjacent mild ground-glass density opacity. Accentuated metabolic activity along this ground-glass density opacity, maximum SUV 5.4. A left axillary lymph node on image 54/4 measures 0.8 cm in short axis and has maximum SUV of 4.1. Incidental CT findings: Coronary, aortic arch, and branch vessel atherosclerotic vascular disease. Fatty density along the cardiac apex likely from a  prior myocardial infarction. ABDOMEN/PELVIS: Widespread accentuated activity in bowel is thought to be physiologic. Incidental CT findings: Aortoiliac atherosclerotic vascular disease. SKELETON: No significant abnormal hypermetabolic activity in this region. Incidental CT findings: Lower lumbar degenerative facet arthropathy. Old likely nonunited or partially united fracture of the left distal clavicle. Bilateral pre-acromial os acromiale. IMPRESSION: 1. The anterior mediastinal lesion is fluid density, homogeneous, and photopenic on the PET portion of the exam. 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 Pericardial cyst is a differential diagnostic consideration. 2. 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 I am uncertain whether this is originally extrathoracic and grew through the intercostal space, or whether it originated in the  intercostal space itself. There is some mildly hypermetabolic activity in the adjacent ground-glass opacity in the lung which is presumably inflammatory, although with a maximum SUV up to 5.4. Some authors (such as Vance Peper all, J Thoracic Disease, 2015 Oct; 7(10): [phone]) recommend elective excision of such fatty masses even when asymptomatic due to the potential for enlargement and potential for aggressive behavior, and the difficulty excluding liposarcoma. Thoracic surgical referral should be recommended. If surgery is not elected, surveillance imaging by CT in 6 months time would be recommended to ensure lack of growth and also to follow up the adjacent ground-glass airspace opacity. 3. Faintly accentuated metabolic activity in a small left axillary lymph node, probably incidental. 4. Other imaging findings of potential clinical significance: Aortic Atherosclerosis (ICD10-I70.0). Coronary atherosclerosis. Fat density along the cardiac apex suggesting prior myocardial infarction. Electronically Signed   By: Van Clines M.D.   On: 02/09/2019 11:13     I have independently reviewed the above radiology studies  and reviewed the findings with the patient.   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  If we see enlargement of the lipoma with this will be excised we will continue to follow the thymic cyst.  Plan CT scan in 6 months       Grace Isaac MD      [address] [place] [address] Office [phone]   Beeper [phone]  06/25/2019 9:32 AM

## 2019-08-28 ENCOUNTER — Other Ambulatory Visit: Payer: Self-pay

## 2019-08-28 ENCOUNTER — Encounter: Payer: Self-pay | Admitting: Podiatry

## 2019-08-28 ENCOUNTER — Ambulatory Visit (INDEPENDENT_AMBULATORY_CARE_PROVIDER_SITE_OTHER): Payer: Medicare HMO | Admitting: Podiatry

## 2019-08-28 DIAGNOSIS — B351 Tinea unguium: Secondary | ICD-10-CM | POA: Diagnosis not present

## 2019-08-28 DIAGNOSIS — M79674 Pain in right toe(s): Secondary | ICD-10-CM | POA: Diagnosis not present

## 2019-08-28 DIAGNOSIS — M79675 Pain in left toe(s): Secondary | ICD-10-CM

## 2019-08-28 DIAGNOSIS — E119 Type 2 diabetes mellitus without complications: Secondary | ICD-10-CM

## 2019-08-28 NOTE — Progress Notes (Signed)
This patient presents to the office with chief complaint of long thick nails and diabetic feet.  This patient  says there  is  no pain and discomfort in their feet.  This patient says there are long thick painful nails.  These nails are painful walking and wearing shoes.  Patient has no history of infection or drainage from both feet.  Patient is unable to  self treat his own nails . This patient presents  to the office today for treatment of the  long nails and a foot evaluation due to history of  diabetes.  General Appearance  Alert, conversant and in no acute stress.  Vascular  Dorsalis pedis   pulses are weakly  palpable  bilaterally. Posterior tibial pulses are absent  B/L.   Capillary return is within normal limits  bilaterally. Temperature is within normal limits  bilaterally.  Neurologic  Senn-Weinstein monofilament wire test within normal limits  bilaterally. Muscle power within normal limits bilaterally.  Nails Thick disfigured discolored nails with subungual debris  from hallux to fifth toes bilaterally. No evidence of bacterial infection or drainage bilaterally.  Orthopedic  No limitations of motion of motion feet .  No crepitus or effusions noted.  No bony pathology or digital deformities noted.  HAV  B/L.  Skin  normotropic skin with no porokeratosis noted bilaterally.  No signs of infections or ulcers noted.     Onychomycosis  Diabetes with no foot complications  IE  Debride nails x 10.  A diabetic foot exam was performed and there is no evidence of neurologic pathology.  Possible vascular pathology  B/L.  RTC 3 months.   Gardiner Barefoot DPM

## 2019-10-02 DIAGNOSIS — H401131 Primary open-angle glaucoma, bilateral, mild stage: Secondary | ICD-10-CM | POA: Diagnosis not present

## 2019-10-02 DIAGNOSIS — H5202 Hypermetropia, left eye: Secondary | ICD-10-CM | POA: Diagnosis not present

## 2019-10-02 DIAGNOSIS — Z961 Presence of intraocular lens: Secondary | ICD-10-CM | POA: Diagnosis not present

## 2019-10-02 DIAGNOSIS — H524 Presbyopia: Secondary | ICD-10-CM | POA: Diagnosis not present

## 2019-10-02 DIAGNOSIS — H52223 Regular astigmatism, bilateral: Secondary | ICD-10-CM | POA: Diagnosis not present

## 2019-11-17 ENCOUNTER — Other Ambulatory Visit: Payer: Self-pay | Admitting: *Deleted

## 2019-11-17 DIAGNOSIS — E328 Other diseases of thymus: Secondary | ICD-10-CM

## 2019-11-17 DIAGNOSIS — R0789 Other chest pain: Secondary | ICD-10-CM

## 2019-11-25 ENCOUNTER — Ambulatory Visit (INDEPENDENT_AMBULATORY_CARE_PROVIDER_SITE_OTHER): Payer: Medicare HMO | Admitting: Podiatry

## 2019-11-25 ENCOUNTER — Encounter: Payer: Self-pay | Admitting: Podiatry

## 2019-11-25 ENCOUNTER — Other Ambulatory Visit: Payer: Self-pay

## 2019-11-25 DIAGNOSIS — M79674 Pain in right toe(s): Secondary | ICD-10-CM | POA: Diagnosis not present

## 2019-11-25 DIAGNOSIS — M79675 Pain in left toe(s): Secondary | ICD-10-CM

## 2019-11-25 DIAGNOSIS — E119 Type 2 diabetes mellitus without complications: Secondary | ICD-10-CM

## 2019-11-25 DIAGNOSIS — B351 Tinea unguium: Secondary | ICD-10-CM

## 2019-11-25 NOTE — Progress Notes (Signed)
Complaint:  Visit Type: Patient returns to my office for continued preventative foot care services. Complaint: Patient states" my nails have grown long and thick and become painful to walk and wear shoes" Patient has been diagnosed with DM with no foot complications. The patient presents for preventative foot care services.  Podiatric Exam: Vascular: dorsalis pedis  are weakly  palpable bilateral.  Posterior tibial pulses are absent  B/L.  Capillary return is immediate. Temperature gradient is WNL. Skin turgor WNL  Sensorium: Normal Semmes Weinstein monofilament test. Normal tactile sensation bilaterally. Nail Exam: Pt has thick disfigured discolored nails with subungual debris noted bilateral entire nail hallux through fifth toenails Ulcer Exam: There is no evidence of ulcer or pre-ulcerative changes or infection. Orthopedic Exam: Muscle tone and strength are WNL. No limitations in general ROM. No crepitus or effusions noted. Foot type and digits show no abnormalities. HAV  B/L. Skin: No Porokeratosis. No infection or ulcers  Diagnosis:  Onychomycosis, , Pain in right toe, pain in left toes  Treatment & Plan Procedures and Treatment: Consent by patient was obtained for treatment procedures.   Debridement of mycotic and hypertrophic toenails, 1 through 5 bilateral and clearing of subungual debris. No ulceration, no infection noted.  Return Visit-Office Procedure: Patient instructed to return to the office for a follow up visit 3 months for continued evaluation and treatment.    Gardiner Barefoot DPM

## 2020-01-07 ENCOUNTER — Ambulatory Visit
Admission: RE | Admit: 2020-01-07 | Discharge: 2020-01-07 | Disposition: A | Discharge status: Home / Self Care | Payer: Medicare HMO | Source: Ambulatory Visit | Attending: Cardiothoracic Surgery | Admitting: Cardiothoracic Surgery

## 2020-01-07 ENCOUNTER — Other Ambulatory Visit: Payer: Self-pay

## 2020-01-07 ENCOUNTER — Encounter: Payer: Self-pay | Admitting: Cardiothoracic Surgery

## 2020-01-07 ENCOUNTER — Ambulatory Visit (INDEPENDENT_AMBULATORY_CARE_PROVIDER_SITE_OTHER): Payer: Medicare HMO | Admitting: Cardiothoracic Surgery

## 2020-01-07 VITALS — BP 148/87 | HR 69 | Temp 97.3°F | Resp 16 | Ht 65.0 in | Wt 197.8 lb

## 2020-01-07 DIAGNOSIS — R0789 Other chest pain: Secondary | ICD-10-CM

## 2020-01-07 DIAGNOSIS — D171 Benign lipomatous neoplasm of skin and subcutaneous tissue of trunk: Secondary | ICD-10-CM | POA: Diagnosis not present

## 2020-01-07 DIAGNOSIS — E328 Other diseases of thymus: Secondary | ICD-10-CM

## 2020-01-07 NOTE — Progress Notes (Signed)
Jessica Jacobs       Jessica Jacobs             [id number]                    Jessica Jacobs  Medical Record Jessica Jacobs Date of Birth: 10/21/1946  Referring: Jessica Carol, Jacobs Primary Care: Jessica Carol, Jacobs Primary Cardiologist: No primary care provider on file.  Chief Complaint:    Chief Complaint  Patient presents with  . Cyst    THYMIC...6 month f/u with CT CHEST  . Lipoma    of the RIGHT chest wall    History of Present Illness:    Jessica Jacobs 74 y.o. female  was seen in the office 6 months ago after abnormal chest xray lead to ct of chest And PET scan.  She now returns with a follow-up 66-month CT scan.  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   The patient says she has had no signs or symptoms of chest discomfort or chest wall pain.  Patient denies any symptoms of Covid, notes she has been careful to avoid exposure.    Current Activity/ Functional Status:  Patient is independent with mobility/ambulation, transfers, ADL's, IADL's.   Zubrod Score: At the time of surgery this patient's most appropriate activity status/level should be described as: []     0    Normal activity, no symptoms [x]     1    Restricted in physical strenuous activity but ambulatory, able to do out light work []     2    Ambulatory and capable of self care, unable to do work activities, up and about               >50 % of waking hours                              []     3    Only limited self care, in bed greater than 50% of waking hours []     4    Completely disabled, no self care, confined to bed or chair []     5    Moribund   Past Medical History:  Diagnosis Date  . Anemia   . BACK PAIN, LUMBAR 12/07/2008   Qualifier: Diagnosis of  By: Jessica Jacobs    . Cellulitis and abscess of other specified site 01/26/2008   Qualifier: Diagnosis of  By: Jessica Jacobs    .  COLECTOMY, PARTIAL, WITH ANASTOMOSIS, HX OF 08/26/2007   Qualifier: Diagnosis of  By: Jessica Jacobs    . Diabetes mellitus without complication ([place])   . DIABETES MELLITUS, TYPE II 08/26/2007   Qualifier: Diagnosis of  By: Jessica Jacobs    . DIVERTICULITIS, HX OF 08/26/2007   Qualifier: Diagnosis of  By: Jessica Jacobs    . GUAIAC POSITIVE STOOL 10/06/2010   Annotation: Dr. Velora Jacobs rec 10.2011:  repeat hemoccults; if + and anemic get  EGD Qualifier: Diagnosis of  By: Jessica Jacobs    . HEADACHE 11/05/2007   Qualifier: Diagnosis of  By: Jessica Jacobs    . HYPERLIPIDEMIA 08/26/2007   Qualifier: Diagnosis of  By: Jessica Jacobs    . Hypertension   . OBESITY 02/22/2011   Qualifier: Diagnosis of  By: Jessica Jacobs    . ONYCHOMYCOSIS 10/26/2009   Qualifier: Diagnosis of  By: Jessica Jacobs    . PHARYNGITIS 12/21/2010   Qualifier: Diagnosis of  By: Jessica Jacobs    . SEBACEOUS CYST, INFECTED 01/26/2008   Qualifier: Diagnosis of  By: Jessica Jacobs    . SKIN TAG 12/19/2009   Qualifier: Diagnosis of  By: Jessica Jacobs    . URI 08/10/2008   Qualifier: Diagnosis of  By: Jessica Jacobs    . VAGINITIS, CANDIDAL 02/28/2010   Qualifier: Diagnosis of  By: Jessica Jacobs    . WEIGHT GAIN 06/15/2008   Qualifier: Diagnosis of  By: Jessica Jacobs      Past Surgical History:  Procedure Laterality Date  . ABDOMINAL HYSTERECTOMY    . BACK SURGERY    . BREAST LUMPECTOMY WITH RADIOACTIVE SEED LOCALIZATION Left 08/26/2018   Procedure: LEFT BREAST LUMPECTOMY WITH RADIOACTIVE SEED LOCALIZATION;  Surgeon: Jessica Jacobs;  Location: [facility];  Service: General;  Laterality: Left;  . EYE SURGERY      Family History  Problem Relation Age of Onset  . Cirrhosis Father   . Breast cancer Neg Hx      Social History   Tobacco Use  Smoking Status Former Smoker  Smokeless Tobacco Never Used  Tobacco Comment    quit 5 years ago    Social History   Substance and Sexual Activity  Alcohol Use No     No Known Allergies  Current Outpatient Medications  Medication Sig Dispense Refill  . Cholecalciferol (VITAMIN D-3 PO) Take by mouth.    . gabapentin (NEURONTIN) 300 MG capsule Take 300 mg by mouth 3 (three) times daily. Take 300mg  every morning and 600mg  every night    . glimepiride (AMARYL) 4 MG tablet Take 4 mg by mouth 2 (two) times daily.    . hydrochlorothiazide (HYDRODIURIL) 25 MG tablet Take 25 mg by mouth daily.    . IRON PO Take by mouth.    Marland Kitchen lisinopril (PRINIVIL,ZESTRIL) 40 MG tablet TAKE ONE TABLET BY MOUTH EVERY DAY IN THE MORNING 30 tablet 6  . lovastatin (MEVACOR) 40 MG tablet Take 40 mg by mouth at bedtime.    . metFORMIN (GLUCOPHAGE) 1000 MG tablet TAKE ONE TABLET BY MOUTH TWICE DAILY 60 tablet 1  . metoprolol succinate (TOPROL-XL) 50 MG 24 hr tablet Take 50 mg by mouth daily. Take with or immediately following a meal.    . sitaGLIPtin (JANUVIA) 100 MG tablet Take 100 mg by mouth daily.    . vitamin B-12 (CYANOCOBALAMIN) 1000 MCG tablet Take 1,000 mcg by mouth daily.     No current facility-administered medications for this visit.    Pertinent items are noted in HPI.   Review of Systems:     Cardiac Review of Systems: [Y] = yes  or   [ N ] = no   Chest Pain Aqua.Slicker  ]  Resting SOB [ N ] Exertional SOB  Aqua.Slicker  ]  Orthopnea [ N]   Pedal Edema [ N  ]    Palpitations [  N] Syncope  Aqua.Slicker  ]   Presyncope Aqua.Slicker  ]   General Review of Systems: [Y] = yes [  ]=no Constitional: recent weight change [  ];  Wt loss over the last 3 months [   ] anorexia [  ]; fatigue [  ]; nausea [  ]; night sweats [  ];  fever [  ]; or chills [  ];           Eye : blurred vision [  ]; diplopia [   ]; vision changes [  ];  Amaurosis fugax[  ]; Resp: cough [  ];  wheezing[  ];  hemoptysis[  ]; shortness of breath[  ]; paroxysmal nocturnal dyspnea[  ]; dyspnea on exertion[  ]; or orthopnea[  ];  GI:  gallstones[  ],  vomiting[  ];  dysphagia[  ]; melena[  ];  hematochezia [  ]; heartburn[  ];   Hx of  Colonoscopy[  ]; GU: kidney stones [  ]; hematuria[  ];   dysuria [  ];  nocturia[  ];  history of     obstruction [  ]; urinary frequency [  ]             Skin: rash, swelling[  ];, hair loss[  ];  peripheral edema[  ];  or itching[  ]; Musculosketetal: myalgias[  ];  joint swelling[  ];  joint erythema[  ];  joint pain[  ];  back pain[  ];  Heme/Lymph: bruising[  ];  bleeding[  ];  anemia[  ];  Neuro: TIA[  ];  headaches[  ];  stroke[  ];  vertigo[  ];  seizures[  ];   paresthesias[  ];  difficulty walking[  ];  Psych:depression[  ]; anxiety[  ];  Endocrine: diabetes[  ];  thyroid dysfunction[  ];  Immunizations: Flu up to date [ n ]; Pneumococcal up to date [ n ];  Other:     PHYSICAL EXAMINATION: BP (!) 148/87 (BP Location: Right Arm, Patient Position: Sitting, Cuff Size: Normal)   Pulse 69   Temp (!) 97.3 F (36.3 C)   Resp 16   Ht 5\' 5"  (1.651 m)   Wt 89.7 kg   SpO2 90% Comment: RA  BMI 32.92 kg/m   General appearance: alert, cooperative and no distress Neck: no adenopathy, no carotid bruit, no JVD, supple, symmetrical, trachea midline and thyroid not enlarged, symmetric, no tenderness/mass/nodules Lymph nodes: Cervical, supraclavicular, and axillary nodes normal. Resp: clear to auscultation bilaterally Cardio: regular rate and rhythm, S1, S2 normal, no murmur, click, rub or gallop GI: soft, non-tender; bowel sounds normal; no masses,  no organomegaly Extremities: extremities normal, atraumatic, no cyanosis or edema Neurologic: Grossly normal   Diagnostic Studies & Laboratory data:     Recent Radiology Findings:   CT CHEST WO CONTRAST  Result Date: 01/07/2020 CLINICAL DATA:  Follow-up of thymic cyst. History of lumpectomy. Right chest wall lipoma. EXAM: CT CHEST WITHOUT CONTRAST TECHNIQUE: Multidetector CT imaging of the chest was performed following the standard protocol without IV  contrast. COMPARISON:  06/25/2019 FINDINGS: Cardiovascular: Mild motion degradation within the lower chest. Aortic and branch vessel atherosclerosis. Tortuous thoracic aorta. Mild cardiomegaly, without pericardial effusion. Multivessel coronary artery atherosclerosis. Mediastinum/Nodes: No supraclavicular adenopathy. No mediastinal or definite hilar adenopathy, given limitations of unenhanced CT. Anterior mediastinal/juxta cardiac fluid density lesion measures 5.2 x 4.7 cm on 53/2. Compare 6.0 x 4.8 cm at the same level on the prior exam. On the order of 6.3 cm craniocaudal on coronal image 36 today versus 6.3 on the prior exam (when remeasured). Lungs/Pleura: No pleural fluid. Subpleural right upper lobe radiation fibrosis. Scattered tiny bilateral pulmonary nodules are again identified. Example at 3 mm within the lingula on 57/8. Upper Abdomen: Mild hepatic steatosis. Normal imaged portions of the spleen, stomach, pancreas, adrenal  glands, kidneys. Musculoskeletal: Remote left clavicular fracture. Right-sided intercostal lipoma between the fourth and fifth ribs with extension minimally into the pleural space. 4.3 x 2.6 cm on 63/2. 4.3 by 2.1 cm on the prior exam (when remeasured). Lower thoracic spondylosis. IMPRESSION: 1. Simple cystic lesion within the anterior mediastinum, minimally decreased. No suspicious characteristics. 2. Right intercostal lipoma, similar to minimally enlarged. 3.  No acute process in the chest. 4. Aortic atherosclerosis (ICD10-I70.0) and emphysema (ICD10-J43.9). 5. Hepatic steatosis. 6. Mild motion degradation. 7. Similar pulmonary nodules. Electronically Signed   By: Abigail Miyamoto M.D.   On: 01/07/2020 09:40  I have independently reviewed the above radiology studies  and reviewed the findings with the patient.     Ct Chest Wo Contrast  Result Date: 01/27/2019 CLINICAL DATA:  Lung nodule EXAM: CT CHEST WITHOUT CONTRAST TECHNIQUE: Multidetector CT imaging of the chest was performed  following the standard protocol without IV contrast. COMPARISON:  Chest x-ray from 10 days ago FINDINGS: Cardiovascular: Normal heart size. No pericardial effusion. Fat within the right and left ventricles without apparent wall thickening or discrete appearance, not convincingly from prior infarct. There is aortic and coronary atherosclerosis. Mediastinum/Nodes: Lobulated anterior mediastinal mass measuring 10 x 5.5 x 3.2 cm. This is along the fatty thymus and is homogeneous. No hilar or middle mediastinal adenopathy. No IMA adenopathy. Lungs/Pleura: Mild airway thickening and minimal centrilobular emphysema. Crescentic right upper lobe opacity along a fatty mass spanning the for intercostal space. The mass has simple fatty internal architecture and measures 4.4 x 2.2 cm on axial slices. The pulmonary opacity is presumably scarring. There are a few subpleural flat nodules which likely reflect lymph nodes. Anticipate CT follow-up. Upper Abdomen: Negative Musculoskeletal: No acute or aggressive finding IMPRESSION: 1. 10 x 5.5 x 3.2 cm anterior mediastinal mass, favor thymic neoplasm over lymphoma. No pleural effusion or soft tissue pleural nodularity 2. The right-sided lung opacity on prior chest x-ray is related to a lipoma traversing the fourth intercostal space, seemingly unrelated to #1. Electronically Signed   By: Monte Fantasia M.D.   On: 01/27/2019 05:13   Nm Pet Image Initial (pi) Skull Base To Thigh  Result Date: 02/09/2019 CLINICAL DATA:  Initial treatment strategy for mediastinal mass. EXAM: NUCLEAR MEDICINE PET SKULL BASE TO THIGH TECHNIQUE: 9.4 mCi F-18 FDG was injected intravenously. Full-ring PET imaging was performed from the skull base to thigh after the radiotracer. CT data was obtained and used for attenuation correction and anatomic localization. Fasting blood glucose: 87 mg/dl COMPARISON:  CT chest from 01/26/2019 FINDINGS: Mediastinal blood pool activity: SUV max 2.9 NECK: Physiologic  appearing activity along the tonsils with minimal right eccentricity but without significant CT correlate. Incidental CT findings: none CHEST: The hypodense anterior mediastinal structure has near fluid density and is photopenic. This primarily fatty mass of the right chest wall deep to the serratus anterior which bulges in between the fourth and fifth ribs into the thoracic cavity, with some adjacent mild ground-glass density opacity. Accentuated metabolic activity along this ground-glass density opacity, maximum SUV 5.4. A left axillary lymph node on image 54/4 measures 0.8 cm in short axis and has maximum SUV of 4.1. Incidental CT findings: Coronary, aortic arch, and branch vessel atherosclerotic vascular disease. Fatty density along the cardiac apex likely from a prior myocardial infarction. ABDOMEN/PELVIS: Widespread accentuated activity in bowel is thought to be physiologic. Incidental CT findings: Aortoiliac atherosclerotic vascular disease. SKELETON: No significant abnormal hypermetabolic activity in this region. Incidental CT findings: Lower lumbar  degenerative facet arthropathy. Old likely nonunited or partially united fracture of the left distal clavicle. Bilateral pre-acromial os acromiale. IMPRESSION: 1. The anterior mediastinal lesion is fluid density, homogeneous, and photopenic on the PET portion of the exam. 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 Pericardial cyst is a differential diagnostic consideration. 2. 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 I am uncertain whether this is originally extrathoracic and grew through the intercostal space, or whether it originated in the intercostal space itself. There is some mildly hypermetabolic activity in the adjacent ground-glass opacity in the lung which is presumably inflammatory, although with a maximum SUV up to  5.4. Some authors (such as Vance Peper all, J Thoracic Disease, 2015 Oct; 7(10): [phone]) recommend elective excision of such fatty masses even when asymptomatic due to the potential for enlargement and potential for aggressive behavior, and the difficulty excluding liposarcoma. Thoracic surgical referral should be recommended. If surgery is not elected, surveillance imaging by CT in 6 months time would be recommended to ensure lack of growth and also to follow up the adjacent ground-glass airspace opacity. 3. Faintly accentuated metabolic activity in a small left axillary lymph node, probably incidental. 4. Other imaging findings of potential clinical significance: Aortic Atherosclerosis (ICD10-I70.0). Coronary atherosclerosis. Fat density along the cardiac apex suggesting prior myocardial infarction. Electronically Signed   By: Van Clines M.D.   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  If we see enlargement of the lipoma with this will be excised we will continue to follow the thymic  cyst.  Plan CT scan in 8 months  With the patient's age diabetes hypertension she is at increased risk of mortality from Covid infection.  In addition the patient is not up-to-date on pneumococcal or flu vaccination.  I discussed with her the importance of all 3 of these vaccinations.  She notes it which she will stop with Dr. Lina Sar office and see about flu vaccination and pneumococcal vaccination and inquire about how to sign up for phase 2 vaccinations for Covid.   Grace Isaac Jacobs      [address] [place] [address] Office [phone]   Beeper [phone]  01/07/2020 10:56 AM

## 2020-02-24 ENCOUNTER — Encounter: Payer: Self-pay | Admitting: Podiatry

## 2020-02-24 ENCOUNTER — Other Ambulatory Visit: Payer: Self-pay

## 2020-02-24 ENCOUNTER — Ambulatory Visit (INDEPENDENT_AMBULATORY_CARE_PROVIDER_SITE_OTHER): Payer: Medicare HMO | Admitting: Podiatry

## 2020-02-24 VITALS — Temp 97.7°F

## 2020-02-24 DIAGNOSIS — M79674 Pain in right toe(s): Secondary | ICD-10-CM

## 2020-02-24 DIAGNOSIS — E119 Type 2 diabetes mellitus without complications: Secondary | ICD-10-CM

## 2020-02-24 DIAGNOSIS — B351 Tinea unguium: Secondary | ICD-10-CM

## 2020-02-24 DIAGNOSIS — M79675 Pain in left toe(s): Secondary | ICD-10-CM | POA: Diagnosis not present

## 2020-02-24 NOTE — Progress Notes (Signed)
Complaint:  Visit Type: Patient returns to my office for continued preventative foot care services. Complaint: Patient states" my nails have grown long and thick and become painful to walk and wear shoes" Patient has been diagnosed with DM with no foot complications. The patient presents for preventative foot care services.  Podiatric Exam: Vascular: dorsalis pedis  are weakly  palpable bilateral.  Posterior tibial pulses are absent  B/L.  Capillary return is immediate. Temperature gradient is WNL. Skin turgor WNL  Sensorium: Normal Semmes Weinstein monofilament test. Normal tactile sensation bilaterally. Nail Exam: Pt has thick disfigured discolored nails with subungual debris noted bilateral entire nail hallux through fifth toenails Ulcer Exam: There is no evidence of ulcer or pre-ulcerative changes or infection. Orthopedic Exam: Muscle tone and strength are WNL. No limitations in general ROM. No crepitus or effusions noted. Foot type and digits show no abnormalities. HAV  B/L. Skin: No Porokeratosis. No infection or ulcers  Diagnosis:  Onychomycosis, , Pain in right toe, pain in left toes  Treatment & Plan Procedures and Treatment: Consent by patient was obtained for treatment procedures.   Debridement of mycotic and hypertrophic toenails, 1 through 5 bilateral and clearing of subungual debris. No ulceration, no infection noted. Visual inspection of the foot was performed. Return Visit-Office Procedure: Patient instructed to return to the office for a follow up visit 3 months for continued evaluation and treatment.    Gardiner Barefoot DPM

## 2020-03-15 DIAGNOSIS — Z961 Presence of intraocular lens: Secondary | ICD-10-CM | POA: Diagnosis not present

## 2020-03-15 DIAGNOSIS — H52223 Regular astigmatism, bilateral: Secondary | ICD-10-CM | POA: Diagnosis not present

## 2020-03-15 DIAGNOSIS — I10 Essential (primary) hypertension: Secondary | ICD-10-CM | POA: Diagnosis not present

## 2020-03-15 DIAGNOSIS — H5202 Hypermetropia, left eye: Secondary | ICD-10-CM | POA: Diagnosis not present

## 2020-03-15 DIAGNOSIS — H524 Presbyopia: Secondary | ICD-10-CM | POA: Diagnosis not present

## 2020-03-15 DIAGNOSIS — H18413 Arcus senilis, bilateral: Secondary | ICD-10-CM | POA: Diagnosis not present

## 2020-03-15 DIAGNOSIS — Z7984 Long term (current) use of oral hypoglycemic drugs: Secondary | ICD-10-CM | POA: Diagnosis not present

## 2020-03-15 DIAGNOSIS — H11153 Pinguecula, bilateral: Secondary | ICD-10-CM | POA: Diagnosis not present

## 2020-03-15 DIAGNOSIS — H5213 Myopia, bilateral: Secondary | ICD-10-CM | POA: Diagnosis not present

## 2020-03-15 DIAGNOSIS — E119 Type 2 diabetes mellitus without complications: Secondary | ICD-10-CM | POA: Diagnosis not present

## 2020-03-18 DIAGNOSIS — I10 Essential (primary) hypertension: Secondary | ICD-10-CM | POA: Diagnosis not present

## 2020-03-18 DIAGNOSIS — E7800 Pure hypercholesterolemia, unspecified: Secondary | ICD-10-CM | POA: Diagnosis not present

## 2020-03-18 DIAGNOSIS — Z7984 Long term (current) use of oral hypoglycemic drugs: Secondary | ICD-10-CM | POA: Diagnosis not present

## 2020-03-18 DIAGNOSIS — E1169 Type 2 diabetes mellitus with other specified complication: Secondary | ICD-10-CM | POA: Diagnosis not present

## 2020-04-06 DIAGNOSIS — Z0100 Encounter for examination of eyes and vision without abnormal findings: Secondary | ICD-10-CM | POA: Diagnosis not present

## 2020-04-06 DIAGNOSIS — H401132 Primary open-angle glaucoma, bilateral, moderate stage: Secondary | ICD-10-CM | POA: Diagnosis not present

## 2020-04-14 ENCOUNTER — Other Ambulatory Visit: Payer: Self-pay | Admitting: Internal Medicine

## 2020-04-14 DIAGNOSIS — Z1231 Encounter for screening mammogram for malignant neoplasm of breast: Secondary | ICD-10-CM

## 2020-05-25 ENCOUNTER — Ambulatory Visit (INDEPENDENT_AMBULATORY_CARE_PROVIDER_SITE_OTHER): Payer: Medicare HMO | Admitting: Podiatry

## 2020-05-25 ENCOUNTER — Encounter: Payer: Self-pay | Admitting: Podiatry

## 2020-05-25 ENCOUNTER — Other Ambulatory Visit: Payer: Self-pay

## 2020-05-25 VITALS — Temp 97.2°F

## 2020-05-25 DIAGNOSIS — E119 Type 2 diabetes mellitus without complications: Secondary | ICD-10-CM | POA: Diagnosis not present

## 2020-05-25 DIAGNOSIS — M79674 Pain in right toe(s): Secondary | ICD-10-CM

## 2020-05-25 DIAGNOSIS — M79675 Pain in left toe(s): Secondary | ICD-10-CM

## 2020-05-25 DIAGNOSIS — B351 Tinea unguium: Secondary | ICD-10-CM

## 2020-05-25 NOTE — Progress Notes (Signed)
This patient returns to my office for at risk foot care.  This patient requires this care by a professional since this patient will be at risk due to having  Diabetes type 2.  This patient is unable to cut nails herself since the patient cannot reach hiernails.These nails are painful walking and wearing shoes.  This patient presents for at risk foot care today.  General Appearance  Alert, conversant and in no acute stress.  Vascular  Dorsalis pedis and posterior tibial  pulses are weakly  palpable  bilaterally.  Capillary return is within normal limits  bilaterally. Temperature is within normal limits  bilaterally.  Neurologic  Senn-Weinstein monofilament wire test within normal limits  bilaterally. Muscle power within normal limits bilaterally.  Nails Thick disfigured discolored nails with subungual debris  from hallux to fifth toes bilaterally. No evidence of bacterial infection or drainage bilaterally.  Orthopedic  No limitations of motion  feet .  No crepitus or effusions noted.  No bony pathology or digital deformities noted.  HAV  B/L.  Skin  normotropic skin with no porokeratosis noted bilaterally.  No signs of infections or ulcers noted.     Onychomycosis  Pain in right toes  Pain in left toes  Consent was obtained for treatment procedures.   Mechanical debridement of nails 1-5  bilaterally performed with a nail nipper.  Filed with dremel without incident.    Return office visit    3 months                  Told patient to return for periodic foot care and evaluation due to potential at risk complications.   Gardiner Barefoot DPM

## 2020-08-23 ENCOUNTER — Other Ambulatory Visit: Payer: Self-pay | Admitting: Cardiothoracic Surgery

## 2020-08-23 DIAGNOSIS — E328 Other diseases of thymus: Secondary | ICD-10-CM

## 2020-08-24 ENCOUNTER — Ambulatory Visit (INDEPENDENT_AMBULATORY_CARE_PROVIDER_SITE_OTHER): Payer: Medicare HMO | Admitting: Podiatry

## 2020-08-24 ENCOUNTER — Other Ambulatory Visit: Payer: Self-pay

## 2020-08-24 ENCOUNTER — Encounter: Payer: Self-pay | Admitting: Podiatry

## 2020-08-24 DIAGNOSIS — M79675 Pain in left toe(s): Secondary | ICD-10-CM | POA: Diagnosis not present

## 2020-08-24 DIAGNOSIS — B351 Tinea unguium: Secondary | ICD-10-CM | POA: Diagnosis not present

## 2020-08-24 DIAGNOSIS — E119 Type 2 diabetes mellitus without complications: Secondary | ICD-10-CM

## 2020-08-24 DIAGNOSIS — M79674 Pain in right toe(s): Secondary | ICD-10-CM

## 2020-08-24 NOTE — Progress Notes (Signed)
This patient returns to my office for at risk foot care.  This patient requires this care by a professional since this patient will be at risk due to having  Diabetes type 2.  This patient is unable to cut nails herself since the patient cannot reach her nails.These nails are painful walking and wearing shoes.  This patient presents for at risk foot care today.  General Appearance  Alert, conversant and in no acute stress.  Vascular  Dorsalis pedis and posterior tibial  pulses are weakly  palpable  bilaterally.  Capillary return is within normal limits  bilaterally. Temperature is within normal limits  bilaterally.  Neurologic  Senn-Weinstein monofilament wire test within normal limits  bilaterally. Muscle power within normal limits bilaterally.  Nails Thick disfigured discolored nails with subungual debris  from hallux to fifth toes bilaterally. No evidence of bacterial infection or drainage bilaterally.  Orthopedic  No limitations of motion  feet .  No crepitus or effusions noted.  No bony pathology or digital deformities noted.  HAV  B/L.  Skin  normotropic skin with no porokeratosis noted bilaterally.  No signs of infections or ulcers noted.     Onychomycosis  Pain in right toes  Pain in left toes  Consent was obtained for treatment procedures.   Mechanical debridement of nails 1-5  bilaterally performed with a nail nipper.  Filed with dremel without incident.    Return office visit    3 months                  Told patient to return for periodic foot care and evaluation due to potential at risk complications.   Gardiner Barefoot DPM

## 2020-09-22 DIAGNOSIS — I700 Atherosclerosis of aorta: Secondary | ICD-10-CM | POA: Diagnosis not present

## 2020-09-22 DIAGNOSIS — E7800 Pure hypercholesterolemia, unspecified: Secondary | ICD-10-CM | POA: Diagnosis not present

## 2020-09-22 DIAGNOSIS — I10 Essential (primary) hypertension: Secondary | ICD-10-CM | POA: Diagnosis not present

## 2020-09-22 DIAGNOSIS — Z1231 Encounter for screening mammogram for malignant neoplasm of breast: Secondary | ICD-10-CM | POA: Diagnosis not present

## 2020-09-22 DIAGNOSIS — Z7984 Long term (current) use of oral hypoglycemic drugs: Secondary | ICD-10-CM | POA: Diagnosis not present

## 2020-09-22 DIAGNOSIS — E1169 Type 2 diabetes mellitus with other specified complication: Secondary | ICD-10-CM | POA: Diagnosis not present

## 2020-09-29 ENCOUNTER — Ambulatory Visit (INDEPENDENT_AMBULATORY_CARE_PROVIDER_SITE_OTHER): Payer: Medicare HMO | Admitting: Cardiothoracic Surgery

## 2020-09-29 ENCOUNTER — Other Ambulatory Visit: Payer: Self-pay

## 2020-09-29 ENCOUNTER — Ambulatory Visit
Admission: RE | Admit: 2020-09-29 | Discharge: 2020-09-29 | Disposition: A | Discharge status: Home / Self Care | Payer: Medicare HMO | Source: Ambulatory Visit | Attending: Cardiothoracic Surgery | Admitting: Cardiothoracic Surgery

## 2020-09-29 VITALS — BP 138/83 | HR 60 | Temp 97.9°F | Resp 20 | Ht 65.0 in | Wt 193.0 lb

## 2020-09-29 DIAGNOSIS — E328 Other diseases of thymus: Secondary | ICD-10-CM

## 2020-09-29 DIAGNOSIS — E1169 Type 2 diabetes mellitus with other specified complication: Secondary | ICD-10-CM | POA: Diagnosis not present

## 2020-09-29 DIAGNOSIS — D1779 Benign lipomatous neoplasm of other sites: Secondary | ICD-10-CM | POA: Diagnosis not present

## 2020-09-29 DIAGNOSIS — E7800 Pure hypercholesterolemia, unspecified: Secondary | ICD-10-CM | POA: Diagnosis not present

## 2020-09-29 DIAGNOSIS — I700 Atherosclerosis of aorta: Secondary | ICD-10-CM | POA: Diagnosis not present

## 2020-09-29 DIAGNOSIS — J9859 Other diseases of mediastinum, not elsewhere classified: Secondary | ICD-10-CM | POA: Diagnosis not present

## 2020-09-29 DIAGNOSIS — D171 Benign lipomatous neoplasm of skin and subcutaneous tissue of trunk: Secondary | ICD-10-CM | POA: Diagnosis not present

## 2020-09-29 DIAGNOSIS — E0840 Diabetes mellitus due to underlying condition with diabetic neuropathy, unspecified: Secondary | ICD-10-CM | POA: Diagnosis not present

## 2020-09-29 DIAGNOSIS — I10 Essential (primary) hypertension: Secondary | ICD-10-CM | POA: Diagnosis not present

## 2020-09-29 NOTE — Progress Notes (Signed)
[address]       [place] [address]             [phone]                    Dixie E Laminack [facility] Medical Record #[id number] Date of Birth: 03-Feb-1946  Referring: Seward Carol, MD Primary Care: Seward Carol, MD Primary Cardiologist: No primary care provider on file.  Chief Complaint:    Chief Complaint  Patient presents with  . Cyst    8 month f/u with Chest CT re-eval thymic syst    History of Present Illness:    Jessica Jacobs 74 y.o. female  Was first seen in the office FEB  2020 after abnormal chest xray lead to ct of chest And PET scan.  She now returns with a follow-up  CT scan.  The patient says she has had no signs or symptoms of chest discomfort or chest wall pain.  She has had Covid vaccination but so far no vaccination for flu or pneumococcal.    Current Activity/ Functional Status:  Patient is independent with mobility/ambulation, transfers, ADL's, IADL's.   Zubrod Score: At the time of surgery this patient's most appropriate activity status/level should be described as: []     0    Normal activity, no symptoms [x]     1    Restricted in physical strenuous activity but ambulatory, able to do out light work []     2    Ambulatory and capable of self care, unable to do work activities, up and about               >50 % of waking hours                              []     3    Only limited self care, in bed greater than 50% of waking hours []     4    Completely disabled, no self care, confined to bed or chair []     5    Moribund   Past Medical History:  Diagnosis Date  . Anemia   . BACK PAIN, LUMBAR 12/07/2008   Qualifier: Diagnosis of  By: Radene Ou MD, [place]    . Cellulitis and abscess of other specified site 01/26/2008   Qualifier: Diagnosis of  By: Radene Ou MD, [place]    . COLECTOMY, PARTIAL, WITH ANASTOMOSIS, HX OF 08/26/2007   Qualifier: Diagnosis of  By: Radene Ou MD, [place]    . Diabetes mellitus without  complication ([facility])   . DIABETES MELLITUS, TYPE II 08/26/2007   Qualifier: Diagnosis of  By: Radene Ou MD, [place]    . DIVERTICULITIS, HX OF 08/26/2007   Qualifier: Diagnosis of  By: Radene Ou MD, [place]    . GUAIAC POSITIVE STOOL 10/06/2010   Annotation: Dr. Velora Heckler rec 10.2011:  repeat hemoccults; if + and anemic get  EGD Qualifier: Diagnosis of  By: Jorene Minors, [place]    . HEADACHE 11/05/2007   Qualifier: Diagnosis of  By: Radene Ou MD, [place]    . HYPERLIPIDEMIA 08/26/2007   Qualifier: Diagnosis of  By: Radene Ou MD, [place]    . Hypertension   . OBESITY 02/22/2011   Qualifier: Diagnosis of  By: Hassell Done FNP, Tori Milks    . ONYCHOMYCOSIS 10/26/2009   Qualifier: Diagnosis of  By: Jorene Minors, [place]    . PHARYNGITIS 12/21/2010   Qualifier: Diagnosis  of  By: Hassell Done FNP, Tori Milks    . SEBACEOUS CYST, INFECTED 01/26/2008   Qualifier: Diagnosis of  By: Radene Ou MD, [place]    . SKIN TAG 12/19/2009   Qualifier: Diagnosis of  By: Jorene Minors, [place]    . URI 08/10/2008   Qualifier: Diagnosis of  By: Radene Ou MD, [place]    . VAGINITIS, CANDIDAL 02/28/2010   Qualifier: Diagnosis of  By: Jorene Minors, Scott    . WEIGHT GAIN 06/15/2008   Qualifier: Diagnosis of  By: Radene Ou MD, [place]      Past Surgical History:  Procedure Laterality Date  . ABDOMINAL HYSTERECTOMY    . BACK SURGERY    . BREAST LUMPECTOMY WITH RADIOACTIVE SEED LOCALIZATION Left 08/26/2018   Procedure: LEFT BREAST LUMPECTOMY WITH RADIOACTIVE SEED LOCALIZATION;  Surgeon: Erroll Luna, MD;  Location: [facility];  Service: General;  Laterality: Left;  . EYE SURGERY      Family History  Problem Relation Age of Onset  . Cirrhosis Father   . Breast cancer Neg Hx      Social History   Tobacco Use  Smoking Status Former Smoker  Smokeless Tobacco Never Used  Tobacco Comment   quit 5 years ago    Social History   Substance and Sexual Activity  Alcohol Use No     No Known Allergies  Current Outpatient  Medications  Medication Sig Dispense Refill  . Accu-Chek Softclix Lancets lancets     . Cholecalciferol (VITAMIN D-3 PO) Take by mouth.    . DROPLET PEN NEEDLES 31G X 8 MM MISC     . gabapentin (NEURONTIN) 300 MG capsule Take 300 mg by mouth 3 (three) times daily. Take 300mg  every morning and 600mg  every night    . glimepiride (AMARYL) 4 MG tablet Take 4 mg by mouth 2 (two) times daily.    . hydrochlorothiazide (HYDRODIURIL) 25 MG tablet Take 25 mg by mouth daily.    . IRON PO Take by mouth.    Marland Kitchen LANTUS SOLOSTAR 100 UNIT/ML Solostar Pen     . lisinopril (PRINIVIL,ZESTRIL) 40 MG tablet TAKE ONE TABLET BY MOUTH EVERY DAY IN THE MORNING 30 tablet 6  . lovastatin (MEVACOR) 40 MG tablet Take 40 mg by mouth at bedtime.    . metFORMIN (GLUCOPHAGE) 1000 MG tablet TAKE ONE TABLET BY MOUTH TWICE DAILY 60 tablet 1  . metoprolol succinate (TOPROL-XL) 50 MG 24 hr tablet Take 50 mg by mouth daily. Take with or immediately following a meal.    . sitaGLIPtin (JANUVIA) 100 MG tablet Take 100 mg by mouth daily.    . Travoprost, BAK Free, (TRAVATAN) 0.004 % SOLN ophthalmic solution     . vitamin B-12 (CYANOCOBALAMIN) 1000 MCG tablet Take 1,000 mcg by mouth daily.     No current facility-administered medications for this visit.    Pertinent items are noted in HPI.   Review of Systems:     Cardiac Review of Systems: [Y] = yes  or   [ N ] = no   Chest Pain [n ]  Resting SOB [n  ] Exertional SOB  [ n]  Orthopnea []    Pedal Edema [  n ]    Palpitations [n  ] Syncope  Florencio.Farrier ]   Presyncope Florencio.Farrier ]   General Review of Systems: [Y] = yes [  ]=no Constitional: recent weight change [  ];  Wt loss over the last 3 months [   ] anorexia [  ]; fatigue [  ];  nausea [  ]; night sweats [  ]; fever [  ]; or chills [  ];           Eye : blurred vision [  ]; diplopia [   ]; vision changes [  ];  Amaurosis fugax[  ]; Resp: cough [  ];  wheezing[  ];  hemoptysis[  ]; shortness of breath[  ]; paroxysmal nocturnal dyspnea[  ]; dyspnea  on exertion[  ]; or orthopnea[  ];  GI:  gallstones[  ], vomiting[  ];  dysphagia[  ]; melena[  ];  hematochezia [  ]; heartburn[  ];   Hx of  Colonoscopy[  ]; GU: kidney stones [  ]; hematuria[  ];   dysuria [  ];  nocturia[  ];  history of     obstruction [  ]; urinary frequency [  ]             Skin: rash, swelling[  ];, hair loss[  ];  peripheral edema[  ];  or itching[  ]; Musculosketetal: myalgias[  ];  joint swelling[  ];  joint erythema[  ];  joint pain[  ];  back pain[  ];  Heme/Lymph: bruising[  ];  bleeding[  ];  anemia[  ];  Neuro: TIA[  ];  headaches[  ];  stroke[  ];  vertigo[  ];  seizures[  ];   paresthesias[  ];  difficulty walking[  ];  Psych:depression[  ]; anxiety[  ];  Endocrine: diabetes[  ];  thyroid dysfunction[  ];  Immunizations: Flu up to date [n  ]; Pneumococcal up to date [ n ];covid [ y ]  Other:     PHYSICAL EXAMINATION: BP 138/83   Pulse 60   Temp 97.9 F (36.6 C) (Skin)   Resp 20   Ht 5\' 5"  (1.651 m)   Wt 193 lb (87.5 kg)   SpO2 98% Comment: RA  BMI 32.12 kg/m  General appearance: alert and no distress Neck: no adenopathy, no carotid bruit, no JVD, supple, symmetrical, trachea midline and thyroid not enlarged, symmetric, no tenderness/mass/nodules Lymph nodes: Cervical, supraclavicular, and axillary nodes normal. Resp: clear to auscultation bilaterally Cardio: regular rate and rhythm, S1, S2 normal, no murmur, click, rub or gallop GI: soft, non-tender; bowel sounds normal; no masses,  no organomegaly Extremities: extremities normal, atraumatic, no cyanosis or edema Neurologic: Grossly normal   Diagnostic Studies & Laboratory data:     Recent Radiology Findings:  CT Chest Wo Contrast  Result Date: 09/29/2020 CLINICAL DATA:  Follow-up thymic cyst. EXAM: CT CHEST WITHOUT CONTRAST TECHNIQUE: Multidetector CT imaging of the chest was performed following the standard protocol without IV contrast. COMPARISON:  CT 01/07/2020 FINDINGS: Cardiovascular:  Coronary artery calcification and aortic atherosclerotic calcification. Mediastinum/Nodes: Anterior mediastinum lobular low-density lesion measuring 4.6 x 5.8 cm compares to 4.8 x 5.2 cm. Visually lesion appears unchanged. Lesion has simple fluid attenuation. No mediastinal hilar lymphadenopathy. Lungs/Pleura: Subpleural fatty lesion in the RIGHT upper lobe (image 58/8) is unchanged from prior. This is extension of an intramuscular lipoma superficial to the ribs (image 59/2). Several sub 5 mm pulmonary nodules unchanged from prior. Upper Abdomen: Limited view of the liver, kidneys, pancreas are unremarkable. Normal adrenal glands. Musculoskeletal: No aggressive osseous lesion IMPRESSION: 1. Stable cystic lesion in the anterior mediastinum. 2. 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 3. Stable scattered small sub 5 mm pulmonary nodules. 4. Coronary artery calcification and Aortic Atherosclerosis (ICD10-I70.0). Electronically Signed   By:  Suzy Bouchard M.D.   On: 09/29/2020 10:35   I have independently reviewed the above radiology studies  and reviewed the findings with the patient.  CT CHEST WO CONTRAST  Result Date: 01/07/2020 CLINICAL DATA:  Follow-up of thymic cyst. History of lumpectomy. Right chest wall lipoma. EXAM: CT CHEST WITHOUT CONTRAST TECHNIQUE: Multidetector CT imaging of the chest was performed following the standard protocol without IV contrast. COMPARISON:  06/25/2019 FINDINGS: Cardiovascular: Mild motion degradation within the lower chest. Aortic and branch vessel atherosclerosis. Tortuous thoracic aorta. Mild cardiomegaly, without pericardial effusion. Multivessel coronary artery atherosclerosis. Mediastinum/Nodes: No supraclavicular adenopathy. No mediastinal or definite hilar adenopathy, given limitations of unenhanced CT. Anterior mediastinal/juxta cardiac fluid density lesion measures 5.2 x 4.7 cm on 53/2. Compare 6.0 x 4.8 cm at the same level on the prior exam. On the  order of 6.3 cm craniocaudal on coronal image 36 today versus 6.3 on the prior exam (when remeasured). Lungs/Pleura: No pleural fluid. Subpleural right upper lobe radiation fibrosis. Scattered tiny bilateral pulmonary nodules are again identified. Example at 3 mm within the lingula on 57/8. Upper Abdomen: Mild hepatic steatosis. Normal imaged portions of the spleen, stomach, pancreas, adrenal glands, kidneys. Musculoskeletal: Remote left clavicular fracture. Right-sided intercostal lipoma between the fourth and fifth ribs with extension minimally into the pleural space. 4.3 x 2.6 cm on 63/2. 4.3 by 2.1 cm on the prior exam (when remeasured). Lower thoracic spondylosis. IMPRESSION: 1. Simple cystic lesion within the anterior mediastinum, minimally decreased. No suspicious characteristics. 2. Right intercostal lipoma, similar to minimally enlarged. 3.  No acute process in the chest. 4. Aortic atherosclerosis (ICD10-I70.0) and emphysema (ICD10-J43.9). 5. Hepatic steatosis. 6. Mild motion degradation. 7. Similar pulmonary nodules. Electronically Signed   By: Abigail Miyamoto M.D.   On: 01/07/2020 09:40     Ct Chest Wo Contrast  Result Date: 01/27/2019 CLINICAL DATA:  Lung nodule EXAM: CT CHEST WITHOUT CONTRAST TECHNIQUE: Multidetector CT imaging of the chest was performed following the standard protocol without IV contrast. COMPARISON:  Chest x-ray from 10 days ago FINDINGS: Cardiovascular: Normal heart size. No pericardial effusion. Fat within the right and left ventricles without apparent wall thickening or discrete appearance, not convincingly from prior infarct. There is aortic and coronary atherosclerosis. Mediastinum/Nodes: Lobulated anterior mediastinal mass measuring 10 x 5.5 x 3.2 cm. This is along the fatty thymus and is homogeneous. No hilar or middle mediastinal adenopathy. No IMA adenopathy. Lungs/Pleura: Mild airway thickening and minimal centrilobular emphysema. Crescentic right upper lobe opacity along  a fatty mass spanning the for intercostal space. The mass has simple fatty internal architecture and measures 4.4 x 2.2 cm on axial slices. The pulmonary opacity is presumably scarring. There are a few subpleural flat nodules which likely reflect lymph nodes. Anticipate CT follow-up. Upper Abdomen: Negative Musculoskeletal: No acute or aggressive finding IMPRESSION: 1. 10 x 5.5 x 3.2 cm anterior mediastinal mass, favor thymic neoplasm over lymphoma. No pleural effusion or soft tissue pleural nodularity 2. The right-sided lung opacity on prior chest x-ray is related to a lipoma traversing the fourth intercostal space, seemingly unrelated to #1. Electronically Signed   By: Monte Fantasia M.D.   On: 01/27/2019 05:13   Nm Pet Image Initial (pi) Skull Base To Thigh  Result Date: 02/09/2019 CLINICAL DATA:  Initial treatment strategy for mediastinal mass. EXAM: NUCLEAR MEDICINE PET SKULL BASE TO THIGH TECHNIQUE: 9.4 mCi F-18 FDG was injected intravenously. Full-ring PET imaging was performed from the skull base to thigh after the radiotracer.  CT data was obtained and used for attenuation correction and anatomic localization. Fasting blood glucose: 87 mg/dl COMPARISON:  CT chest from 01/26/2019 FINDINGS: Mediastinal blood pool activity: SUV max 2.9 NECK: Physiologic appearing activity along the tonsils with minimal right eccentricity but without significant CT correlate. Incidental CT findings: none CHEST: The hypodense anterior mediastinal structure has near fluid density and is photopenic. This primarily fatty mass of the right chest wall deep to the serratus anterior which bulges in between the fourth and fifth ribs into the thoracic cavity, with some adjacent mild ground-glass density opacity. Accentuated metabolic activity along this ground-glass density opacity, maximum SUV 5.4. A left axillary lymph node on image 54/4 measures 0.8 cm in short axis and has maximum SUV of 4.1. Incidental CT findings: Coronary,  aortic arch, and branch vessel atherosclerotic vascular disease. Fatty density along the cardiac apex likely from a prior myocardial infarction. ABDOMEN/PELVIS: Widespread accentuated activity in bowel is thought to be physiologic. Incidental CT findings: Aortoiliac atherosclerotic vascular disease. SKELETON: No significant abnormal hypermetabolic activity in this region. Incidental CT findings: Lower lumbar degenerative facet arthropathy. Old likely nonunited or partially united fracture of the left distal clavicle. Bilateral pre-acromial os acromiale. IMPRESSION: 1. The anterior mediastinal lesion is fluid density, homogeneous, and photopenic on the PET portion of the exam. 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 Pericardial cyst is a differential diagnostic consideration. 2. 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 I am uncertain whether this is originally extrathoracic and grew through the intercostal space, or whether it originated in the intercostal space itself. There is some mildly hypermetabolic activity in the adjacent ground-glass opacity in the lung which is presumably inflammatory, although with a maximum SUV up to 5.4. Some authors (such as Vance Peper all, J Thoracic Disease, 2015 Oct; 7(10): [phone]) recommend elective excision of such fatty masses even when asymptomatic due to the potential for enlargement and potential for aggressive behavior, and the difficulty excluding liposarcoma. Thoracic surgical referral should be recommended. If surgery is not elected, surveillance imaging by CT in 6 months time would be recommended to ensure lack of growth and also to follow up the adjacent ground-glass airspace opacity. 3. Faintly accentuated metabolic activity in a small left axillary lymph node, probably incidental. 4. Other imaging findings of potential clinical  significance: Aortic Atherosclerosis (ICD10-I70.0). Coronary atherosclerosis. Fat density along the cardiac apex suggesting prior myocardial infarction. Electronically Signed   By: Van Clines M.D.   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  This is unchanged on follow-up CT scan in 6  months 2/thoracic intramuscular lipoma in the right fourth intercostal space asymtomatic-unchanged on follow-up CT scan in 6 months  Plan follow-up CT of the chest in 8 to 10 months  Grace Isaac MD      [address] [place] [address] Office [phone]   Beeper [phone]  10/02/2020 9:20 PM

## 2020-10-03 DIAGNOSIS — H401131 Primary open-angle glaucoma, bilateral, mild stage: Secondary | ICD-10-CM | POA: Diagnosis not present

## 2020-10-03 DIAGNOSIS — Z961 Presence of intraocular lens: Secondary | ICD-10-CM | POA: Diagnosis not present

## 2020-11-30 ENCOUNTER — Encounter: Payer: Self-pay | Admitting: Podiatry

## 2020-11-30 ENCOUNTER — Other Ambulatory Visit: Payer: Self-pay

## 2020-11-30 ENCOUNTER — Ambulatory Visit (INDEPENDENT_AMBULATORY_CARE_PROVIDER_SITE_OTHER): Payer: Medicare HMO | Admitting: Podiatry

## 2020-11-30 DIAGNOSIS — E119 Type 2 diabetes mellitus without complications: Secondary | ICD-10-CM | POA: Diagnosis not present

## 2020-11-30 DIAGNOSIS — M79675 Pain in left toe(s): Secondary | ICD-10-CM | POA: Diagnosis not present

## 2020-11-30 DIAGNOSIS — M79674 Pain in right toe(s): Secondary | ICD-10-CM

## 2020-11-30 DIAGNOSIS — B351 Tinea unguium: Secondary | ICD-10-CM

## 2020-11-30 NOTE — Progress Notes (Signed)
This patient returns to my office for at risk foot care.  This patient requires this care by a professional since this patient will be at risk due to having  Diabetes type 2.  This patient is unable to cut nails herself since the patient cannot reach her nails.These nails are painful walking and wearing shoes.  This patient presents for at risk foot care today.  General Appearance  Alert, conversant and in no acute stress.  Vascular  Dorsalis pedis and posterior tibial  pulses are weakly  palpable  bilaterally.  Capillary return is within normal limits  bilaterally. Temperature is within normal limits  Bilaterally. Absent hair.  Neurologic  Senn-Weinstein monofilament wire test within normal limits  bilaterally. Muscle power within normal limits bilaterally.  Nails Thick disfigured discolored nails with subungual debris  from hallux to fifth toes bilaterally. No evidence of bacterial infection or drainage bilaterally.  Orthopedic  No limitations of motion  feet .  No crepitus or effusions noted.  No bony pathology or digital deformities noted.  HAV  B/L.  Skin  normotropic skin with no porokeratosis noted bilaterally.  No signs of infections or ulcers noted.     Onychomycosis  Pain in right toes  Pain in left toes  Consent was obtained for treatment procedures.   Mechanical debridement of nails 1-5  bilaterally performed with a nail nipper.  Filed with dremel without incident.     Return office visit    3 months                  Told patient to return for periodic foot care and evaluation due to potential at risk complications.   Archita Lomeli DPM  

## 2020-12-19 DIAGNOSIS — E0840 Diabetes mellitus due to underlying condition with diabetic neuropathy, unspecified: Secondary | ICD-10-CM | POA: Diagnosis not present

## 2020-12-19 DIAGNOSIS — E7800 Pure hypercholesterolemia, unspecified: Secondary | ICD-10-CM | POA: Diagnosis not present

## 2020-12-19 DIAGNOSIS — E1169 Type 2 diabetes mellitus with other specified complication: Secondary | ICD-10-CM | POA: Diagnosis not present

## 2020-12-19 DIAGNOSIS — I10 Essential (primary) hypertension: Secondary | ICD-10-CM | POA: Diagnosis not present

## 2020-12-20 IMAGING — CT CT CHEST W/O CM
2 of 4 series · 11 of 36 positions shown, 13 images · non-contrast
Comparison: Chest x-ray from 10 days ago

CLINICAL DATA: Lung nodule

EXAM:
CT CHEST WITHOUT CONTRAST
TECHNIQUE: Multidetector CT imaging of the chest was performed following the
standard protocol without IV contrast.

[Series 2: chest 2.00 br40 s3 ax · axial · 0.57mm/px · z∈[+1633,+1865]mm · 8 of 138 slices shown, 10 images]
[im 11/138  mediastinal]
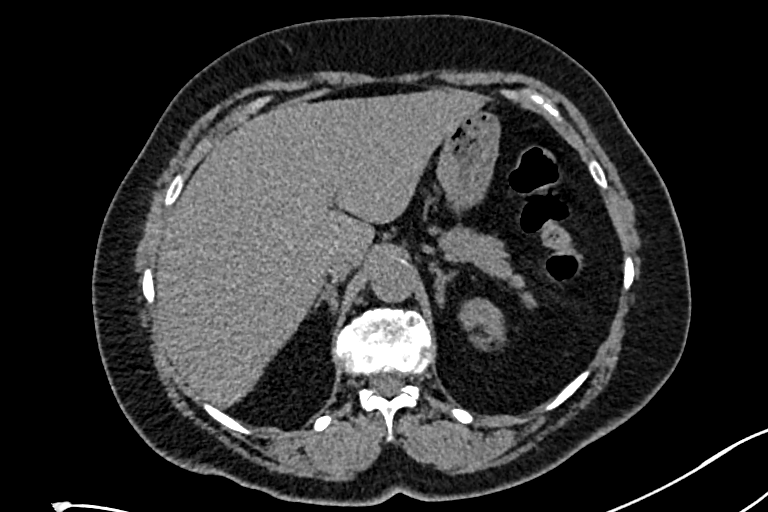
[im 11/138  lung]
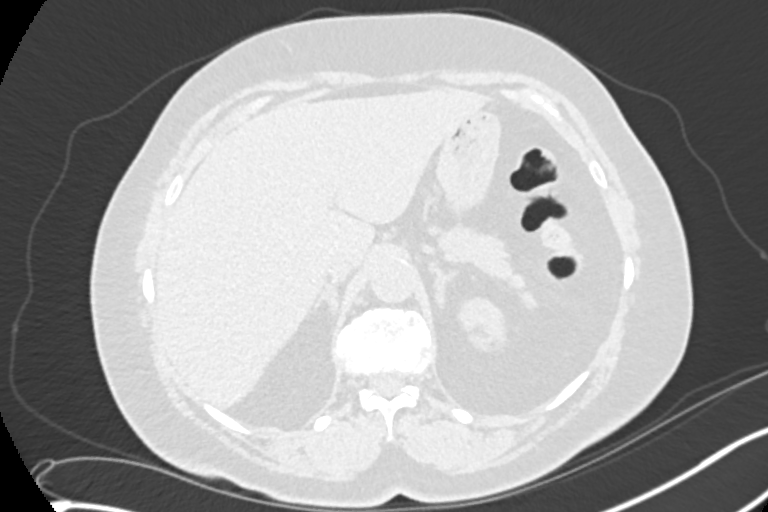
[im 32/138  lung]
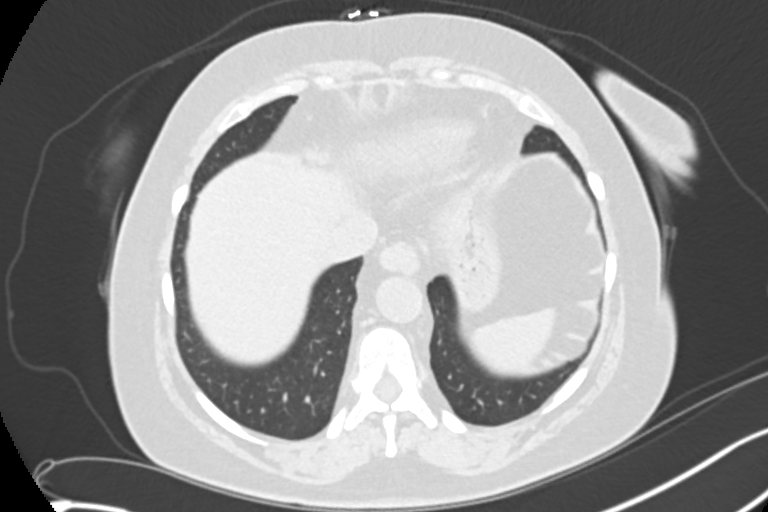
[im 43/138  lung]
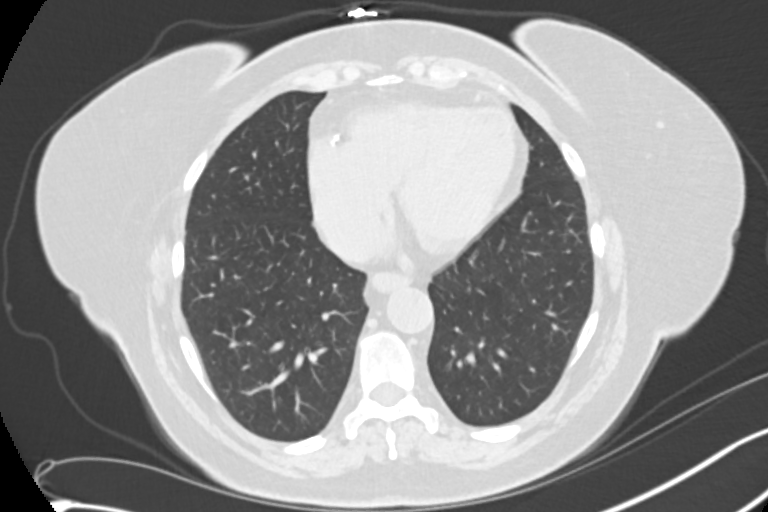
[im 64/138  lung]
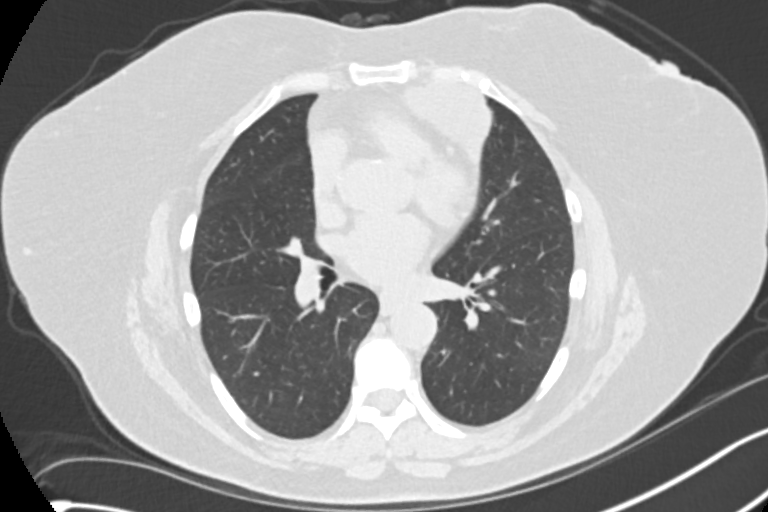
[im 74/138  mediastinal]
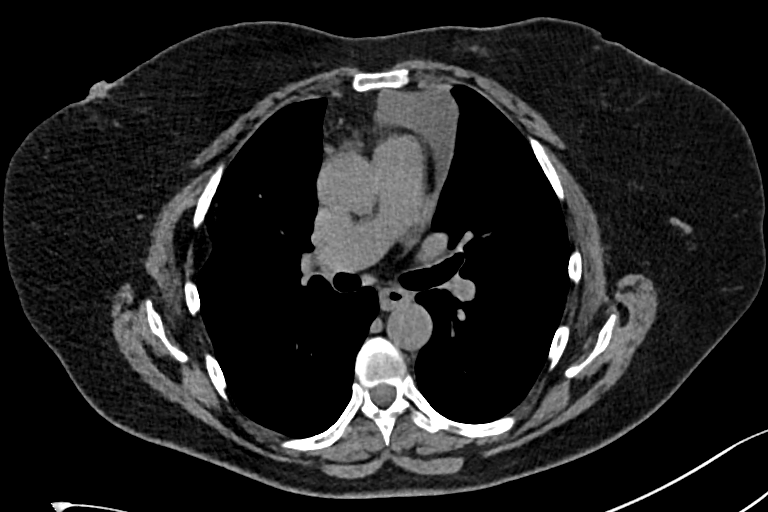
[im 74/138  lung]
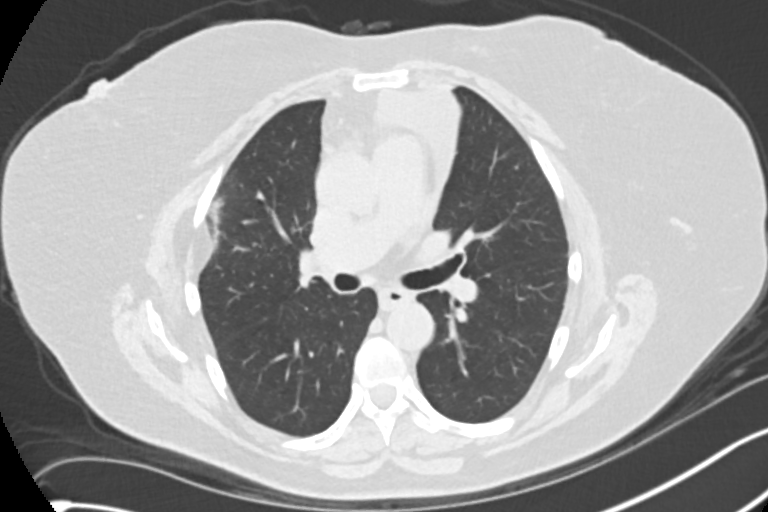
[im 95/138  lung]
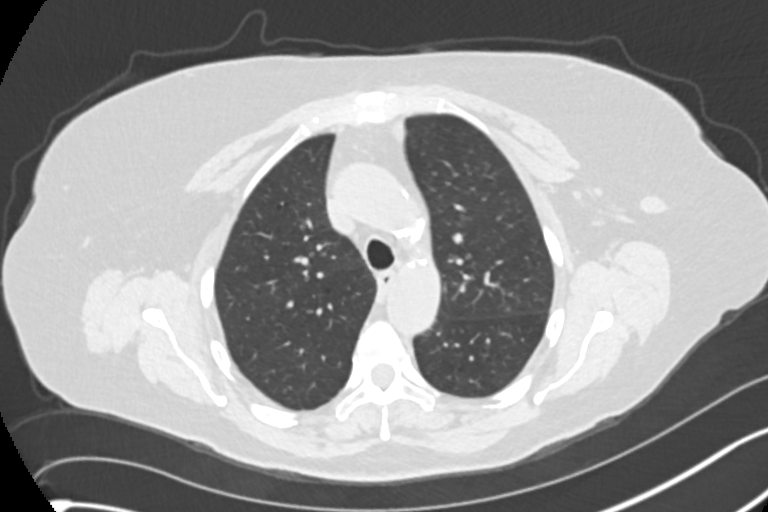
[im 106/138  lung]
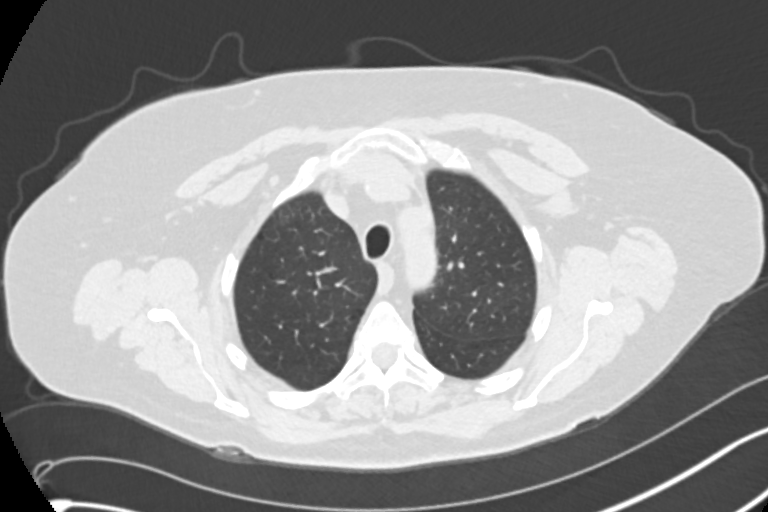
[im 127/138  lung]
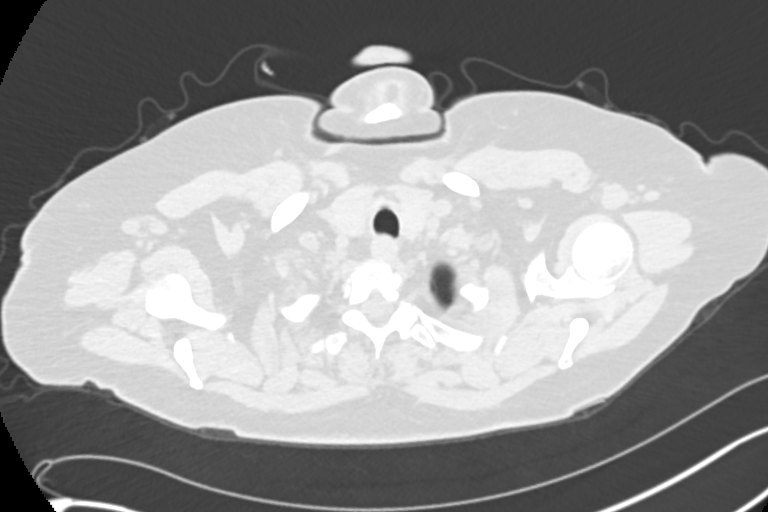

[Series 4: chest 2.00 br40 s3 cor · coronal · 0.54mm/px · 3 of 145 slices shown]
[im 29/145  lung]
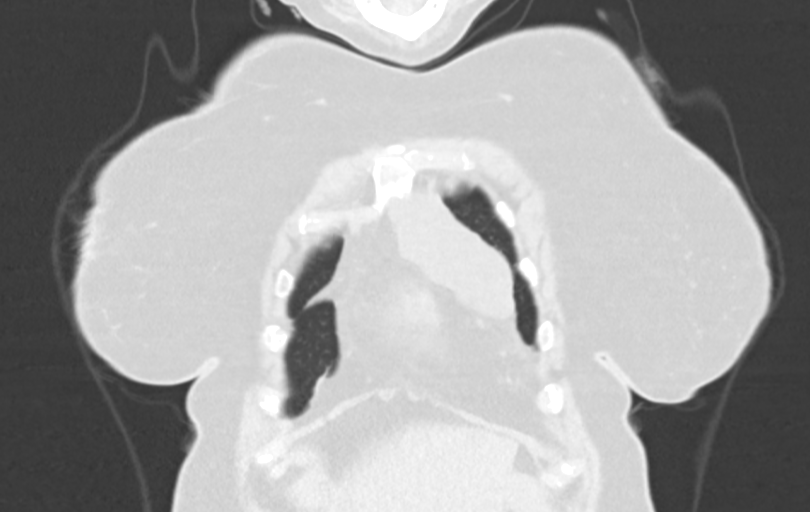
[im 58/145  lung]
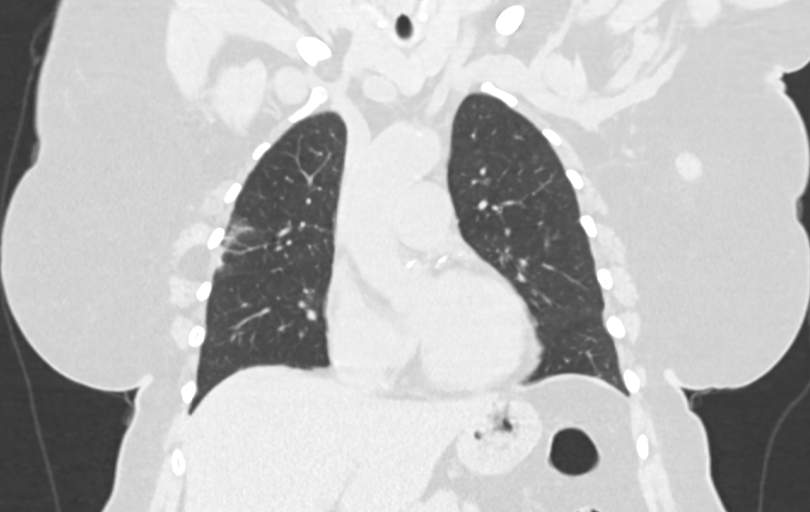
[im 87/145  lung]
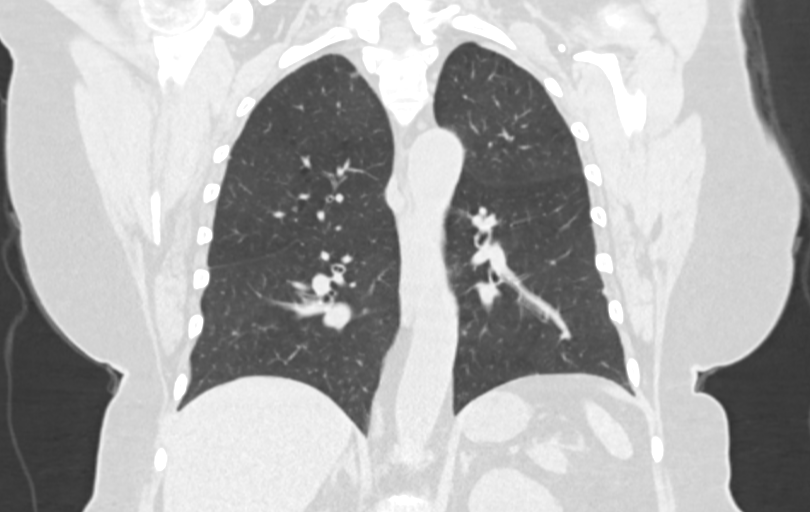

[11 of 36 positions shown; findings below may reference images not displayed]

FINDINGS: Cardiovascular: Normal heart size. No pericardial effusion. Fat
within the right and left ventricles without apparent wall
thickening or discrete appearance, not convincingly from prior
infarct. There is aortic and coronary atherosclerosis.

Mediastinum/Nodes: Lobulated anterior mediastinal mass measuring 10
x 5.5 x 3.2 cm. This is along the fatty thymus and is homogeneous.
No hilar or middle mediastinal adenopathy. No IMA adenopathy.

Lungs/Pleura: Mild airway thickening and minimal centrilobular
emphysema. Crescentic right upper lobe opacity along a fatty mass
spanning the for intercostal space. The mass has simple fatty
internal architecture and measures 4.4 x 2.2 cm on axial slices. The
pulmonary opacity is presumably scarring. There are a few subpleural
flat nodules which likely reflect lymph nodes. Anticipate CT
follow-up.

Upper Abdomen: Negative

Musculoskeletal: No acute or aggressive finding
IMPRESSION: 1. 10 x 5.5 x 3.2 cm anterior mediastinal mass, favor thymic
neoplasm over lymphoma. No pleural effusion or soft tissue pleural
nodularity
2. The right-sided lung opacity on prior chest x-ray is related to a
lipoma traversing the fourth intercostal space, seemingly unrelated
to #1.

## 2021-03-01 DIAGNOSIS — E7800 Pure hypercholesterolemia, unspecified: Secondary | ICD-10-CM | POA: Diagnosis not present

## 2021-03-01 DIAGNOSIS — E1169 Type 2 diabetes mellitus with other specified complication: Secondary | ICD-10-CM | POA: Diagnosis not present

## 2021-03-01 DIAGNOSIS — I10 Essential (primary) hypertension: Secondary | ICD-10-CM | POA: Diagnosis not present

## 2021-03-08 ENCOUNTER — Ambulatory Visit (INDEPENDENT_AMBULATORY_CARE_PROVIDER_SITE_OTHER): Payer: Medicare HMO | Admitting: Podiatry

## 2021-03-08 ENCOUNTER — Other Ambulatory Visit: Payer: Self-pay

## 2021-03-08 DIAGNOSIS — M2010 Hallux valgus (acquired), unspecified foot: Secondary | ICD-10-CM

## 2021-03-08 DIAGNOSIS — M79674 Pain in right toe(s): Secondary | ICD-10-CM

## 2021-03-08 DIAGNOSIS — B351 Tinea unguium: Secondary | ICD-10-CM | POA: Diagnosis not present

## 2021-03-08 DIAGNOSIS — M79675 Pain in left toe(s): Secondary | ICD-10-CM

## 2021-03-08 DIAGNOSIS — E119 Type 2 diabetes mellitus without complications: Secondary | ICD-10-CM

## 2021-03-08 NOTE — Progress Notes (Addendum)
This patient returns to my office for at risk foot care.  This patient requires this care by a professional since this patient will be at risk due to having  Diabetes type 2 with neuropathy..  This patient is unable to cut nails herself since the patient cannot reach her nails.These nails are painful walking and wearing shoes.  This patient presents for at risk foot care today.  General Appearance  Alert, conversant and in no acute stress.  Vascular  Dorsalis pedis and posterior tibial  pulses are weakly  palpable  bilaterally.  Capillary return is within normal limits  bilaterally. Temperature is within normal limits  Bilaterally. Absent hair.  Neurologic  Senn-Weinstein monofilament wire test diminished   bilaterally. Muscle power within normal limits bilaterally.  Nails Thick disfigured discolored nails with subungual debris  from hallux to fifth toes bilaterally. No evidence of bacterial infection or drainage bilaterally.  Orthopedic  No limitations of motion  feet .  No crepitus or effusions noted.  No bony pathology or digital deformities noted.  HAV  B/L.  Skin  normotropic skin with no porokeratosis noted bilaterally.  No signs of infections or ulcers noted.     Onychomycosis  Pain in right toes  Pain in left toes  Consent was obtained for treatment procedures.   Mechanical debridement of nails 1-5  bilaterally performed with a nail nipper.  Filed with dremel without incident. Patient qualifies for diabetic shoes due to DPN and HAV  B/L.   Return office visit    3 months                  Told patient to return for periodic foot care and evaluation due to potential at risk complications.   Gardiner Barefoot DPM

## 2021-03-21 DIAGNOSIS — I700 Atherosclerosis of aorta: Secondary | ICD-10-CM | POA: Diagnosis not present

## 2021-03-21 DIAGNOSIS — E1169 Type 2 diabetes mellitus with other specified complication: Secondary | ICD-10-CM | POA: Diagnosis not present

## 2021-03-21 DIAGNOSIS — R5383 Other fatigue: Secondary | ICD-10-CM | POA: Diagnosis not present

## 2021-03-21 DIAGNOSIS — E663 Overweight: Secondary | ICD-10-CM | POA: Diagnosis not present

## 2021-03-21 DIAGNOSIS — I10 Essential (primary) hypertension: Secondary | ICD-10-CM | POA: Diagnosis not present

## 2021-03-30 DIAGNOSIS — Z0000 Encounter for general adult medical examination without abnormal findings: Secondary | ICD-10-CM | POA: Diagnosis not present

## 2021-03-30 DIAGNOSIS — Z23 Encounter for immunization: Secondary | ICD-10-CM | POA: Diagnosis not present

## 2021-03-30 DIAGNOSIS — Z1389 Encounter for screening for other disorder: Secondary | ICD-10-CM | POA: Diagnosis not present

## 2021-04-05 ENCOUNTER — Other Ambulatory Visit: Payer: Self-pay | Admitting: Thoracic Surgery (Cardiothoracic Vascular Surgery)

## 2021-04-05 DIAGNOSIS — E328 Other diseases of thymus: Secondary | ICD-10-CM

## 2021-04-26 ENCOUNTER — Other Ambulatory Visit: Payer: Self-pay

## 2021-04-26 ENCOUNTER — Ambulatory Visit (INDEPENDENT_AMBULATORY_CARE_PROVIDER_SITE_OTHER): Payer: Medicare HMO

## 2021-04-26 DIAGNOSIS — E119 Type 2 diabetes mellitus without complications: Secondary | ICD-10-CM

## 2021-04-26 NOTE — Progress Notes (Signed)
Patient was foam casted today and picked [employer] performance sneaker [id number] black as 1st option and OrthoFeet [place] [id number] as the 2nd option. Patient is a size 10 1/2 women's wide. Dr. Delfina Redwood is the her diabetic provider. Advised patient we will call when shoes are ready for pick-up.

## 2021-05-16 ENCOUNTER — Ambulatory Visit
Admission: RE | Admit: 2021-05-16 | Discharge: 2021-05-16 | Disposition: A | Discharge status: Home / Self Care | Payer: Medicare HMO | Source: Ambulatory Visit | Attending: Internal Medicine | Admitting: Internal Medicine

## 2021-05-16 ENCOUNTER — Other Ambulatory Visit: Payer: Self-pay

## 2021-05-16 DIAGNOSIS — Z1231 Encounter for screening mammogram for malignant neoplasm of breast: Secondary | ICD-10-CM

## 2021-06-02 ENCOUNTER — Other Ambulatory Visit: Payer: Self-pay

## 2021-06-02 ENCOUNTER — Ambulatory Visit
Admission: RE | Admit: 2021-06-02 | Discharge: 2021-06-02 | Disposition: A | Discharge status: Home / Self Care | Payer: Medicare HMO | Source: Ambulatory Visit | Attending: Thoracic Surgery (Cardiothoracic Vascular Surgery) | Admitting: Thoracic Surgery (Cardiothoracic Vascular Surgery)

## 2021-06-02 ENCOUNTER — Ambulatory Visit (INDEPENDENT_AMBULATORY_CARE_PROVIDER_SITE_OTHER): Payer: Medicare HMO | Admitting: Thoracic Surgery (Cardiothoracic Vascular Surgery)

## 2021-06-02 VITALS — BP 126/78 | HR 67 | Resp 20 | Ht 65.0 in | Wt 190.0 lb

## 2021-06-02 DIAGNOSIS — E328 Other diseases of thymus: Secondary | ICD-10-CM | POA: Diagnosis not present

## 2021-06-02 DIAGNOSIS — J9811 Atelectasis: Secondary | ICD-10-CM | POA: Diagnosis not present

## 2021-06-02 DIAGNOSIS — D171 Benign lipomatous neoplasm of skin and subcutaneous tissue of trunk: Secondary | ICD-10-CM | POA: Diagnosis not present

## 2021-06-02 DIAGNOSIS — J9859 Other diseases of mediastinum, not elsewhere classified: Secondary | ICD-10-CM | POA: Diagnosis not present

## 2021-06-02 DIAGNOSIS — I2510 Atherosclerotic heart disease of native coronary artery without angina pectoris: Secondary | ICD-10-CM | POA: Diagnosis not present

## 2021-06-02 NOTE — Progress Notes (Signed)
[address]       [place] [address]             [phone]                    Jessica Jacobs Medical Record #[id number] Date of Birth: 10/18/1946  Referring: Seward Carol, MD Primary Care: Seward Carol, MD Primary Cardiologist: None  Chief Complaint:    Chief Complaint  Patient presents with  . thymic cyst    10 month f/u with Chest CT    History of Present Illness:    Jessica Jacobs 75 y.o. female presents for her annual surveillance scan.  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  She does complain of some exertional dyspnea.  She denies any pain along her chest wall at the location of the lipoma site.  She does not want to proceed with any surgical interventions.    Past Medical History:  Diagnosis Date  . Anemia   . BACK PAIN, LUMBAR 12/07/2008   Qualifier: Diagnosis of  By: Radene Ou MD, [place]    . Cellulitis and abscess of other specified site 01/26/2008   Qualifier: Diagnosis of  By: Radene Ou MD, [place]    . COLECTOMY, PARTIAL, WITH ANASTOMOSIS, HX OF 08/26/2007   Qualifier: Diagnosis of  By: Radene Ou MD, [place]    . Diabetes mellitus without complication ([facility])   . DIABETES MELLITUS, TYPE II 08/26/2007   Qualifier: Diagnosis of  By: Radene Ou MD, [place]    . DIVERTICULITIS, HX OF 08/26/2007   Qualifier: Diagnosis of  By: Radene Ou MD, [place]    . GUAIAC POSITIVE STOOL 10/06/2010   Annotation: Dr. Velora Heckler rec 10.2011:  repeat hemoccults; if + and anemic get  EGD Qualifier: Diagnosis of  By: Jorene Minors, [place]    . HEADACHE 11/05/2007   Qualifier: Diagnosis of  By: Radene Ou MD, [place]    . HYPERLIPIDEMIA 08/26/2007   Qualifier: Diagnosis of  By: Radene Ou MD, [place]    . Hypertension   . OBESITY 02/22/2011   Qualifier: Diagnosis of  By: Hassell Done FNP, Tori Milks    . ONYCHOMYCOSIS 10/26/2009   Qualifier: Diagnosis of  By: Jorene Minors, [place]    . PHARYNGITIS 12/21/2010   Qualifier:  Diagnosis of  By: Hassell Done FNP, Tori Milks    . SEBACEOUS CYST, INFECTED 01/26/2008   Qualifier: Diagnosis of  By: Radene Ou MD, [place]    . SKIN TAG 12/19/2009   Qualifier: Diagnosis of  By: Jorene Minors, [place]    . URI 08/10/2008   Qualifier: Diagnosis of  By: Radene Ou MD, [place]    . VAGINITIS, CANDIDAL 02/28/2010   Qualifier: Diagnosis of  By: Jorene Minors, [place]    . WEIGHT GAIN 06/15/2008   Qualifier: Diagnosis of  By: Radene Ou MD, [place]      Past Surgical History:  Procedure Laterality Date  . ABDOMINAL HYSTERECTOMY    . BACK SURGERY    . BREAST LUMPECTOMY WITH RADIOACTIVE SEED LOCALIZATION Left 08/26/2018   Procedure: LEFT BREAST LUMPECTOMY WITH RADIOACTIVE SEED LOCALIZATION;  Surgeon: Erroll Luna, MD;  Location: [facility];  Service: General;  Laterality: Left;  . EYE SURGERY      Family History  Problem Relation Age of Onset  . Cirrhosis Father   . Breast cancer Neg Hx      Social History   Tobacco Use  Smoking Status Former Smoker  Smokeless  Tobacco Never Used  Tobacco Comment   quit 5 years ago    Social History   Substance and Sexual Activity  Alcohol Use No     No Known Allergies  Current Outpatient Medications  Medication Sig Dispense Refill  . ACCU-CHEK AVIVA PLUS test strip     . Accu-Chek Softclix Lancets lancets     . Alcohol Swabs (B-D SINGLE USE SWABS REGULAR) PADS     . Cholecalciferol (VITAMIN D-3 PO) Take by mouth.    . DROPLET PEN NEEDLES 31G X 8 MM MISC     . gabapentin (NEURONTIN) 300 MG capsule Take 300 mg by mouth 3 (three) times daily. Take 300mg  every morning and 600mg  every night    . glimepiride (AMARYL) 4 MG tablet Take 4 mg by mouth 2 (two) times daily.    . hydrochlorothiazide (HYDRODIURIL) 25 MG tablet Take 25 mg by mouth daily.    . IRON PO Take by mouth.    Marland Kitchen LANTUS SOLOSTAR 100 UNIT/ML Solostar Pen     . lisinopril (PRINIVIL,ZESTRIL) 40 MG tablet TAKE ONE TABLET BY MOUTH EVERY DAY IN THE MORNING 30 tablet 6   . lovastatin (MEVACOR) 40 MG tablet Take 40 mg by mouth at bedtime.    . metFORMIN (GLUCOPHAGE) 1000 MG tablet TAKE ONE TABLET BY MOUTH TWICE DAILY 60 tablet 1  . metoprolol succinate (TOPROL-XL) 50 MG 24 hr tablet Take 50 mg by mouth daily. Take with or immediately following a meal.    . sitaGLIPtin (JANUVIA) 100 MG tablet Take 100 mg by mouth daily.    . Travoprost, BAK Free, (TRAVATAN) 0.004 % SOLN ophthalmic solution     . vitamin B-12 (CYANOCOBALAMIN) 1000 MCG tablet Take 1,000 mcg by mouth daily.     No current facility-administered medications for this visit.    Review of Systems  Respiratory: Positive for shortness of breath.   All other systems reviewed and are negative.   PHYSICAL EXAMINATION: BP 126/78   Pulse 67   Resp 20   Ht 5\' 5"  (1.651 m)   Wt 190 lb (86.2 kg)   SpO2 93% Comment: RA  BMI 31.62 kg/m   Physical Exam Constitutional:      Appearance: She is normal weight.  HENT:     Head: Normocephalic and atraumatic.  Eyes:     Extraocular Movements: Extraocular movements intact.  Cardiovascular:     Rate and Rhythm: Normal rate.  Pulmonary:     Effort: Pulmonary effort is normal.  Musculoskeletal:     Cervical back: Normal range of motion.  Neurological:     General: No focal deficit present.     Mental Status: She is alert and oriented to person, place, and time.      Diagnostic Studies & Laboratory data:     Recent Radiology Findings:   CT Chest Wo Contrast  Result Date: 06/02/2021 CLINICAL DATA:  Thymic mass, follow-up EXAM: CT CHEST WITHOUT CONTRAST TECHNIQUE: Multidetector CT imaging of the chest was performed following the standard protocol without IV contrast. Sagittal and coronal MPR images reconstructed from axial data set. COMPARISON:  09/29/2020, 01/07/2020 FINDINGS: Cardiovascular: Atherosclerotic calcifications aorta, coronary arteries, and proximal great vessels. Aorta normal caliber. Fat deposition within LEFT ventricular wall at apex,  less at lateral wall. No pericardial effusion. Mediastinum/Nodes: Again identified well-defined low-attenuation mass in anterior mediastinum with maximal dimensions of 5.8 cm AP, 5.9 cm transverse, and 7.3 cm craniocaudal, unchanged. No calcifications or definite soft tissue components. Finding remains most  consistent with a thymic cyst. No thoracic adenopathy. Esophagus unremarkable. Base of cervical region unremarkable. Lungs/Pleura: Minimal atelectasis at lateral LEFT lower lobe base and at anterior base of lingula. Lungs otherwise clear. No infiltrate, pleural effusion, or pneumothorax. Tiny subpleural nodule LEFT lower lobe 3 mm image 93 unchanged. Additional area of subpleural thickening LEFT lower lobe 6 mm diameter image 82 unchanged. Upper Abdomen: Visualized upper abdomen unremarkable Musculoskeletal: No acute osseous findings. Again identified lipoma of the RIGHT lateral chest wall, extending trans costal to subpleural space, 5.9 x 2.7 x 3.8 cm. IMPRESSION: Stable cystic lesion in the anterior mediastinum, favor thymic cyst. Minimal LEFT basilar atelectasis with stable nodularity LEFT lower lobe. RIGHT lateral chest wall lipoma, stable. Extensive atherosclerotic calcifications including coronary arteries. Aortic Atherosclerosis (ICD10-I70.0). Electronically Signed   By: Lavonia Dana M.D.   On: 06/02/2021 10:22   MM 3D SCREEN BREAST BILATERAL  Result Date: 05/17/2021 CLINICAL DATA:  Screening. EXAM: DIGITAL SCREENING BILATERAL MAMMOGRAM WITH TOMOSYNTHESIS AND CAD TECHNIQUE: Bilateral screening digital craniocaudal and mediolateral oblique mammograms were obtained. Bilateral screening digital breast tomosynthesis was performed. The images were evaluated with computer-aided detection. COMPARISON:  Previous exam(s). ACR Breast Density Category b: There are scattered areas of fibroglandular density. FINDINGS: There are no findings suspicious for malignancy. The images were evaluated with computer-aided  detection. IMPRESSION: No mammographic evidence of malignancy. A result letter of this screening mammogram will be mailed directly to the patient. RECOMMENDATION: Screening mammogram in one year. (Code:SM-B-01Y) BI-RADS CATEGORY  1: Negative. Electronically Signed   By: Everlean Alstrom M.D.   On: 05/17/2021 09:37       I have independently reviewed the above radiology studies  and reviewed the findings with the patient.   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  We discussed the possibility of undergoing a robotic resection of thymic cyst but the patient does not wish to proceed.  I have offered her further surveillance, and she will come back in a year with another CT chest.      Lajuana Matte 06/02/2021 4:46 PM

## 2021-06-13 DIAGNOSIS — E1169 Type 2 diabetes mellitus with other specified complication: Secondary | ICD-10-CM | POA: Diagnosis not present

## 2021-06-13 DIAGNOSIS — I10 Essential (primary) hypertension: Secondary | ICD-10-CM | POA: Diagnosis not present

## 2021-06-13 DIAGNOSIS — E7800 Pure hypercholesterolemia, unspecified: Secondary | ICD-10-CM | POA: Diagnosis not present

## 2021-06-14 ENCOUNTER — Other Ambulatory Visit: Payer: Self-pay

## 2021-06-14 ENCOUNTER — Encounter: Payer: Self-pay | Admitting: Podiatry

## 2021-06-14 ENCOUNTER — Ambulatory Visit (INDEPENDENT_AMBULATORY_CARE_PROVIDER_SITE_OTHER): Payer: Medicare HMO | Admitting: Podiatry

## 2021-06-14 DIAGNOSIS — M79675 Pain in left toe(s): Secondary | ICD-10-CM | POA: Diagnosis not present

## 2021-06-14 DIAGNOSIS — G622 Polyneuropathy due to other toxic agents: Secondary | ICD-10-CM | POA: Insufficient documentation

## 2021-06-14 DIAGNOSIS — E119 Type 2 diabetes mellitus without complications: Secondary | ICD-10-CM | POA: Diagnosis not present

## 2021-06-14 DIAGNOSIS — K922 Gastrointestinal hemorrhage, unspecified: Secondary | ICD-10-CM | POA: Insufficient documentation

## 2021-06-14 DIAGNOSIS — E7800 Pure hypercholesterolemia, unspecified: Secondary | ICD-10-CM | POA: Insufficient documentation

## 2021-06-14 DIAGNOSIS — M79674 Pain in right toe(s): Secondary | ICD-10-CM

## 2021-06-14 DIAGNOSIS — I700 Atherosclerosis of aorta: Secondary | ICD-10-CM | POA: Insufficient documentation

## 2021-06-14 DIAGNOSIS — E663 Overweight: Secondary | ICD-10-CM | POA: Insufficient documentation

## 2021-06-14 DIAGNOSIS — B351 Tinea unguium: Secondary | ICD-10-CM | POA: Diagnosis not present

## 2021-06-14 DIAGNOSIS — E2839 Other primary ovarian failure: Secondary | ICD-10-CM | POA: Insufficient documentation

## 2021-06-14 DIAGNOSIS — R911 Solitary pulmonary nodule: Secondary | ICD-10-CM | POA: Insufficient documentation

## 2021-06-14 DIAGNOSIS — E1140 Type 2 diabetes mellitus with diabetic neuropathy, unspecified: Secondary | ICD-10-CM | POA: Insufficient documentation

## 2021-06-14 DIAGNOSIS — K921 Melena: Secondary | ICD-10-CM | POA: Insufficient documentation

## 2021-06-14 DIAGNOSIS — R928 Other abnormal and inconclusive findings on diagnostic imaging of breast: Secondary | ICD-10-CM | POA: Insufficient documentation

## 2021-06-14 NOTE — Progress Notes (Signed)
This patient returns to my office for at risk foot care.  This patient requires this care by a professional since this patient will be at risk due to having  Diabetes type 2.  This patient is unable to cut nails herself since the patient cannot reach her nails.These nails are painful walking and wearing shoes.  This patient presents for at risk foot care today.  General Appearance  Alert, conversant and in no acute stress.  Vascular  Dorsalis pedis and posterior tibial  pulses are weakly  palpable  bilaterally.  Capillary return is within normal limits  bilaterally. Temperature is within normal limits  Bilaterally. Absent hair.  Neurologic  Senn-Weinstein monofilament wire test within normal limits  bilaterally. Muscle power within normal limits bilaterally.  Nails Thick disfigured discolored nails with subungual debris  from hallux to fifth toes bilaterally. No evidence of bacterial infection or drainage bilaterally.  Orthopedic  No limitations of motion  feet .  No crepitus or effusions noted.  No bony pathology or digital deformities noted.  HAV  B/L.  Skin  normotropic skin with no porokeratosis noted bilaterally.  No signs of infections or ulcers noted.     Onychomycosis  Pain in right toes  Pain in left toes  Consent was obtained for treatment procedures.   Mechanical debridement of nails 1-5  bilaterally performed with a nail nipper.  Filed with dremel without incident.     Return office visit    3 months                  Told patient to return for periodic foot care and evaluation due to potential at risk complications.   Lynsee Wands DPM  

## 2021-07-06 DIAGNOSIS — E1169 Type 2 diabetes mellitus with other specified complication: Secondary | ICD-10-CM | POA: Diagnosis not present

## 2021-07-06 DIAGNOSIS — I10 Essential (primary) hypertension: Secondary | ICD-10-CM | POA: Diagnosis not present

## 2021-07-06 DIAGNOSIS — E7800 Pure hypercholesterolemia, unspecified: Secondary | ICD-10-CM | POA: Diagnosis not present

## 2021-07-18 ENCOUNTER — Telehealth: Payer: Self-pay | Admitting: Podiatry

## 2021-07-18 NOTE — Telephone Encounter (Signed)
Pt left 2 messages after 530 last night returning call to schedule an appt to pick up diabetic shoes.  I returned call and left message for pt to call to schedule an appt and that our office does close at 500.

## 2021-07-31 ENCOUNTER — Ambulatory Visit (INDEPENDENT_AMBULATORY_CARE_PROVIDER_SITE_OTHER): Payer: Medicare HMO | Admitting: *Deleted

## 2021-07-31 ENCOUNTER — Other Ambulatory Visit: Payer: Self-pay

## 2021-07-31 DIAGNOSIS — M2011 Hallux valgus (acquired), right foot: Secondary | ICD-10-CM

## 2021-07-31 DIAGNOSIS — M2012 Hallux valgus (acquired), left foot: Secondary | ICD-10-CM

## 2021-07-31 DIAGNOSIS — E119 Type 2 diabetes mellitus without complications: Secondary | ICD-10-CM | POA: Diagnosis not present

## 2021-07-31 DIAGNOSIS — M2010 Hallux valgus (acquired), unspecified foot: Secondary | ICD-10-CM

## 2021-07-31 NOTE — Progress Notes (Signed)
Patient presents today to pick up diabetic shoes and insoles.  Patient was dispensed 1 pair of diabetic shoes and 3 pairs of foam casted diabetic insoles. Fit was satisfactory. Instructions for break-in and wear was reviewed and a copy was given to the patient.   Re-appointment for regularly scheduled diabetic foot care visits or if they should experience any trouble with the shoes or insoles.  

## 2021-08-17 DIAGNOSIS — E1169 Type 2 diabetes mellitus with other specified complication: Secondary | ICD-10-CM | POA: Diagnosis not present

## 2021-08-17 DIAGNOSIS — E7800 Pure hypercholesterolemia, unspecified: Secondary | ICD-10-CM | POA: Diagnosis not present

## 2021-08-17 DIAGNOSIS — I10 Essential (primary) hypertension: Secondary | ICD-10-CM | POA: Diagnosis not present

## 2021-08-29 DIAGNOSIS — H40009 Preglaucoma, unspecified, unspecified eye: Secondary | ICD-10-CM | POA: Diagnosis not present

## 2021-08-29 DIAGNOSIS — Z0100 Encounter for examination of eyes and vision without abnormal findings: Secondary | ICD-10-CM | POA: Diagnosis not present

## 2021-08-29 DIAGNOSIS — E109 Type 1 diabetes mellitus without complications: Secondary | ICD-10-CM | POA: Diagnosis not present

## 2021-08-29 DIAGNOSIS — E7800 Pure hypercholesterolemia, unspecified: Secondary | ICD-10-CM | POA: Diagnosis not present

## 2021-08-29 DIAGNOSIS — H52229 Regular astigmatism, unspecified eye: Secondary | ICD-10-CM | POA: Diagnosis not present

## 2021-09-14 DIAGNOSIS — I10 Essential (primary) hypertension: Secondary | ICD-10-CM | POA: Diagnosis not present

## 2021-09-14 DIAGNOSIS — N1831 Chronic kidney disease, stage 3a: Secondary | ICD-10-CM | POA: Diagnosis not present

## 2021-09-14 DIAGNOSIS — E1169 Type 2 diabetes mellitus with other specified complication: Secondary | ICD-10-CM | POA: Diagnosis not present

## 2021-09-14 DIAGNOSIS — E7800 Pure hypercholesterolemia, unspecified: Secondary | ICD-10-CM | POA: Diagnosis not present

## 2021-09-20 ENCOUNTER — Encounter: Payer: Self-pay | Admitting: Podiatry

## 2021-09-20 ENCOUNTER — Other Ambulatory Visit: Payer: Self-pay

## 2021-09-20 ENCOUNTER — Ambulatory Visit (INDEPENDENT_AMBULATORY_CARE_PROVIDER_SITE_OTHER): Payer: Medicare HMO | Admitting: Podiatry

## 2021-09-20 DIAGNOSIS — M79675 Pain in left toe(s): Secondary | ICD-10-CM

## 2021-09-20 DIAGNOSIS — M79674 Pain in right toe(s): Secondary | ICD-10-CM | POA: Diagnosis not present

## 2021-09-20 DIAGNOSIS — E119 Type 2 diabetes mellitus without complications: Secondary | ICD-10-CM

## 2021-09-20 DIAGNOSIS — B351 Tinea unguium: Secondary | ICD-10-CM | POA: Diagnosis not present

## 2021-09-20 NOTE — Progress Notes (Signed)
This patient returns to my office for at risk foot care.  This patient requires this care by a professional since this patient will be at risk due to having  Diabetes type 2.  This patient is unable to cut nails herself since the patient cannot reach her nails.These nails are painful walking and wearing shoes.  This patient presents for at risk foot care today.  General Appearance  Alert, conversant and in no acute stress.  Vascular  Dorsalis pedis and posterior tibial  pulses are weakly  palpable  bilaterally.  Capillary return is within normal limits  bilaterally. Temperature is within normal limits  Bilaterally. Absent hair.  Neurologic  Senn-Weinstein monofilament wire test within normal limits  bilaterally. Muscle power within normal limits bilaterally.  Nails Thick disfigured discolored nails with subungual debris  from hallux to fifth toes bilaterally. No evidence of bacterial infection or drainage bilaterally.  Orthopedic  No limitations of motion  feet .  No crepitus or effusions noted.  No bony pathology or digital deformities noted.  HAV  B/L.  Skin  normotropic skin with no porokeratosis noted bilaterally.  No signs of infections or ulcers noted.     Onychomycosis  Pain in right toes  Pain in left toes  Consent was obtained for treatment procedures.   Mechanical debridement of nails 1-5  bilaterally performed with a nail nipper.  Filed with dremel without incident.     Return office visit    3 months                  Told patient to return for periodic foot care and evaluation due to potential at risk complications.   Oleda Borski DPM  

## 2021-09-22 DIAGNOSIS — N1831 Chronic kidney disease, stage 3a: Secondary | ICD-10-CM | POA: Diagnosis not present

## 2021-09-22 DIAGNOSIS — I700 Atherosclerosis of aorta: Secondary | ICD-10-CM | POA: Diagnosis not present

## 2021-09-22 DIAGNOSIS — E663 Overweight: Secondary | ICD-10-CM | POA: Diagnosis not present

## 2021-09-22 DIAGNOSIS — Z7984 Long term (current) use of oral hypoglycemic drugs: Secondary | ICD-10-CM | POA: Diagnosis not present

## 2021-09-22 DIAGNOSIS — I10 Essential (primary) hypertension: Secondary | ICD-10-CM | POA: Diagnosis not present

## 2021-09-22 DIAGNOSIS — E1169 Type 2 diabetes mellitus with other specified complication: Secondary | ICD-10-CM | POA: Diagnosis not present

## 2021-09-22 DIAGNOSIS — E7800 Pure hypercholesterolemia, unspecified: Secondary | ICD-10-CM | POA: Diagnosis not present

## 2021-12-12 DIAGNOSIS — I10 Essential (primary) hypertension: Secondary | ICD-10-CM | POA: Diagnosis not present

## 2021-12-12 DIAGNOSIS — E1169 Type 2 diabetes mellitus with other specified complication: Secondary | ICD-10-CM | POA: Diagnosis not present

## 2021-12-12 DIAGNOSIS — E7800 Pure hypercholesterolemia, unspecified: Secondary | ICD-10-CM | POA: Diagnosis not present

## 2021-12-12 DIAGNOSIS — N1831 Chronic kidney disease, stage 3a: Secondary | ICD-10-CM | POA: Diagnosis not present

## 2021-12-20 ENCOUNTER — Ambulatory Visit (INDEPENDENT_AMBULATORY_CARE_PROVIDER_SITE_OTHER): Payer: Medicare HMO | Admitting: Podiatry

## 2021-12-20 ENCOUNTER — Encounter: Payer: Self-pay | Admitting: Podiatry

## 2021-12-20 ENCOUNTER — Other Ambulatory Visit: Payer: Self-pay

## 2021-12-20 DIAGNOSIS — M2010 Hallux valgus (acquired), unspecified foot: Secondary | ICD-10-CM

## 2021-12-20 DIAGNOSIS — M79675 Pain in left toe(s): Secondary | ICD-10-CM

## 2021-12-20 DIAGNOSIS — M79674 Pain in right toe(s): Secondary | ICD-10-CM

## 2021-12-20 DIAGNOSIS — E119 Type 2 diabetes mellitus without complications: Secondary | ICD-10-CM | POA: Diagnosis not present

## 2021-12-20 DIAGNOSIS — B351 Tinea unguium: Secondary | ICD-10-CM | POA: Diagnosis not present

## 2021-12-20 NOTE — Progress Notes (Signed)
This patient returns to my office for at risk foot care.  This patient requires this care by a professional since this patient will be at risk due to having  Diabetes type 2.  This patient is unable to cut nails herself since the patient cannot reach her nails.These nails are painful walking and wearing shoes.  This patient presents for at risk foot care today.  General Appearance  Alert, conversant and in no acute stress.  Vascular  Dorsalis pedis and posterior tibial  pulses are weakly  palpable  bilaterally.  Capillary return is within normal limits  bilaterally. Temperature is within normal limits  Bilaterally. Absent hair.  Neurologic  Senn-Weinstein monofilament wire test within normal limits  bilaterally. Muscle power within normal limits bilaterally.  Nails Thick disfigured discolored nails with subungual debris  from hallux to fifth toes bilaterally. No evidence of bacterial infection or drainage bilaterally.  Orthopedic  No limitations of motion  feet .  No crepitus or effusions noted.  No bony pathology or digital deformities noted.  HAV  B/L.  Skin  normotropic skin with no porokeratosis noted bilaterally.  No signs of infections or ulcers noted.     Onychomycosis  Pain in right toes  Pain in left toes  Consent was obtained for treatment procedures.   Mechanical debridement of nails 1-5  bilaterally performed with a nail nipper.  Filed with dremel without incident.     Return office visit    3 months                  Told patient to return for periodic foot care and evaluation due to potential at risk complications.   Dayna Geurts DPM  

## 2022-03-20 ENCOUNTER — Encounter: Payer: Self-pay | Admitting: Podiatry

## 2022-03-20 ENCOUNTER — Ambulatory Visit (INDEPENDENT_AMBULATORY_CARE_PROVIDER_SITE_OTHER): Payer: Medicare HMO | Admitting: Podiatry

## 2022-03-20 ENCOUNTER — Other Ambulatory Visit: Payer: Self-pay

## 2022-03-20 DIAGNOSIS — I700 Atherosclerosis of aorta: Secondary | ICD-10-CM | POA: Diagnosis not present

## 2022-03-20 DIAGNOSIS — B351 Tinea unguium: Secondary | ICD-10-CM

## 2022-03-20 DIAGNOSIS — M79675 Pain in left toe(s): Secondary | ICD-10-CM | POA: Diagnosis not present

## 2022-03-20 DIAGNOSIS — E119 Type 2 diabetes mellitus without complications: Secondary | ICD-10-CM

## 2022-03-20 DIAGNOSIS — I10 Essential (primary) hypertension: Secondary | ICD-10-CM | POA: Diagnosis not present

## 2022-03-20 DIAGNOSIS — M2010 Hallux valgus (acquired), unspecified foot: Secondary | ICD-10-CM

## 2022-03-20 DIAGNOSIS — E7800 Pure hypercholesterolemia, unspecified: Secondary | ICD-10-CM | POA: Diagnosis not present

## 2022-03-20 DIAGNOSIS — M79674 Pain in right toe(s): Secondary | ICD-10-CM

## 2022-03-20 DIAGNOSIS — E1122 Type 2 diabetes mellitus with diabetic chronic kidney disease: Secondary | ICD-10-CM | POA: Diagnosis not present

## 2022-03-20 DIAGNOSIS — N1831 Chronic kidney disease, stage 3a: Secondary | ICD-10-CM | POA: Diagnosis not present

## 2022-03-20 DIAGNOSIS — Z794 Long term (current) use of insulin: Secondary | ICD-10-CM | POA: Diagnosis not present

## 2022-03-20 DIAGNOSIS — R0609 Other forms of dyspnea: Secondary | ICD-10-CM | POA: Diagnosis not present

## 2022-03-20 DIAGNOSIS — J439 Emphysema, unspecified: Secondary | ICD-10-CM | POA: Diagnosis not present

## 2022-03-20 DIAGNOSIS — E663 Overweight: Secondary | ICD-10-CM | POA: Diagnosis not present

## 2022-03-20 NOTE — Progress Notes (Signed)
This patient returns to my office for at risk foot care.  This patient requires this care by a professional since this patient will be at risk due to having  Diabetes type 2.  This patient is unable to cut nails herself since the patient cannot reach her nails.These nails are painful walking and wearing shoes.  This patient presents for at risk foot care today.  General Appearance  Alert, conversant and in no acute stress.  Vascular  Dorsalis pedis and posterior tibial  pulses are weakly  palpable  bilaterally.  Capillary return is within normal limits  bilaterally. Temperature is within normal limits  Bilaterally. Absent hair.  Neurologic  Senn-Weinstein monofilament wire test within normal limits  bilaterally. Muscle power within normal limits bilaterally.  Nails Thick disfigured discolored nails with subungual debris  from hallux to fifth toes bilaterally. No evidence of bacterial infection or drainage bilaterally.  Orthopedic  No limitations of motion  feet .  No crepitus or effusions noted.  No bony pathology or digital deformities noted.  HAV  B/L.  Skin  normotropic skin with no porokeratosis noted bilaterally.  No signs of infections or ulcers noted.     Onychomycosis  Pain in right toes  Pain in left toes  Consent was obtained for treatment procedures.   Mechanical debridement of nails 1-5  bilaterally performed with a nail nipper.  Filed with dremel without incident.     Return office visit    3 months                  Told patient to return for periodic foot care and evaluation due to potential at risk complications.   Aneesh Faller DPM  

## 2022-04-05 ENCOUNTER — Other Ambulatory Visit: Payer: Self-pay | Admitting: Internal Medicine

## 2022-04-05 DIAGNOSIS — Z1231 Encounter for screening mammogram for malignant neoplasm of breast: Secondary | ICD-10-CM

## 2022-04-17 ENCOUNTER — Encounter: Payer: Self-pay | Admitting: Pulmonary Disease

## 2022-04-17 ENCOUNTER — Ambulatory Visit (INDEPENDENT_AMBULATORY_CARE_PROVIDER_SITE_OTHER): Payer: Medicare HMO | Admitting: Pulmonary Disease

## 2022-04-17 VITALS — BP 138/78 | HR 66 | Temp 97.6°F | Ht 65.5 in | Wt 196.0 lb

## 2022-04-17 DIAGNOSIS — J449 Chronic obstructive pulmonary disease, unspecified: Secondary | ICD-10-CM | POA: Diagnosis not present

## 2022-04-17 DIAGNOSIS — Z87891 Personal history of nicotine dependence: Secondary | ICD-10-CM | POA: Diagnosis not present

## 2022-04-17 MED ORDER — ALBUTEROL SULFATE HFA 108 (90 BASE) MCG/ACT IN AERS: 2.0000 | INHALATION_SPRAY | Freq: Four times a day (QID) | RESPIRATORY_TRACT | 6 refills | Status: AC | PRN

## 2022-04-17 NOTE — Progress Notes (Signed)
? ?Synopsis: Referred in April 2023 for COPD by Seward Carol, MD ? ?Subjective:  ? ?PATIENT ID: Jessica Jacobs GENDER: female DOB: Apr 19, 1946, MRN: [id number] ? ?HPI ? ?Chief Complaint  ?Patient presents with  ? Consult  ?  SOB when walking. Occassional coughing and wheezing.  ? ?Jessica Jacobs is a 76 year old woman, former smoker with DM II who is referred to pulmonary clinic for COPD evaluation.  ? ?She reports having dyspnea after walking 1 block. She also has cough and wheezing with the dyspnea. This has been occurring over the past year. Her symptoms improved with trelegy inhaler from her primary care doctor.  ? ?She quit smoking 5-6 years ago. She smoked for 30-40 years, about 1 pack per day. She is retired and worked as a Chartered certified accountant.  ? ?She denies issues with GERD. She has some seasonal allergies during the spring and summer. Hot humid days bother her breathing.  ? ?No family history of lung disease. ? ?Past Medical History:  ?Diagnosis Date  ? Anemia   ? BACK PAIN, LUMBAR 12/07/2008  ? Qualifier: Diagnosis of  By: Radene Ou MD, [place]    ? Cellulitis and abscess of other specified site 01/26/2008  ? Qualifier: Diagnosis of  By: Radene Ou MD, [place]    ? COLECTOMY, PARTIAL, WITH ANASTOMOSIS, HX OF 08/26/2007  ? Qualifier: Diagnosis of  By: Radene Ou MD, [place]    ? Diabetes mellitus without complication ([place])   ? DIABETES MELLITUS, TYPE II 08/26/2007  ? Qualifier: Diagnosis of  By: Radene Ou MD, [place]    ? DIVERTICULITIS, HX OF 08/26/2007  ? Qualifier: Diagnosis of  By: Radene Ou MD, [place]    ? GUAIAC POSITIVE STOOL 10/06/2010  ? Annotation: Dr. Velora Heckler rec 10.2011:  repeat hemoccults; if + and anemic get  EGD Qualifier: Diagnosis of  By: Jorene Minors, [place]    ? HEADACHE 11/05/2007  ? Qualifier: Diagnosis of  By: Radene Ou MD, [place]    ? HYPERLIPIDEMIA 08/26/2007  ? Qualifier: Diagnosis of  By: Radene Ou MD, [place]    ? Hypertension   ? OBESITY 02/22/2011  ? Qualifier: Diagnosis of  By: Hassell Done FNP, Tori Milks     ? ONYCHOMYCOSIS 10/26/2009  ? Qualifier: Diagnosis of  By: Jorene Minors, [place]    ? PHARYNGITIS 12/21/2010  ? Qualifier: Diagnosis of  By: Hassell Done FNP, Tori Milks    ? SEBACEOUS CYST, INFECTED 01/26/2008  ? Qualifier: Diagnosis of  By: Radene Ou MD, [place]    ? SKIN TAG 12/19/2009  ? Qualifier: Diagnosis of  By: Jorene Minors, Scott    ? URI 08/10/2008  ? Qualifier: Diagnosis of  By: Radene Ou MD, [place]    ? VAGINITIS, CANDIDAL 02/28/2010  ? Qualifier: Diagnosis of  By: Jorene Minors, Scott    ? WEIGHT GAIN 06/15/2008  ? Qualifier: Diagnosis of  By: Radene Ou MD, [place]    ?  ? ?Family History  ?Problem Relation Age of Onset  ? Cirrhosis Father   ? Breast cancer Neg Hx   ?  ? ?Social History  ? ?Socioeconomic History  ? Marital status: Single  ?  Spouse name: Not on file  ? Number of children: Not on file  ? Years of education: Not on file  ? Highest education level: Not on file  ?Occupational History  ? Not on file  ?Tobacco Use  ? Smoking status: Former  ? Smokeless tobacco: Never  ? Tobacco comments:  ?  quit 5 years ago  ?Vaping Use  ? Vaping Use: Never  used  ?Substance and Sexual Activity  ? Alcohol use: No  ? Drug use: Yes  ?  Types: Marijuana  ? Sexual activity: Never  ?  Birth control/protection: Surgical  ?Other Topics Concern  ? Not on file  ?Social History Narrative  ? Not on file  ? ?Social Determinants of Health  ? ?Financial Resource Strain: Not on file  ?Food Insecurity: Not on file  ?Transportation Needs: Not on file  ?Physical Activity: Not on file  ?Stress: Not on file  ?Social Connections: Not on file  ?Intimate Partner Violence: Not on file  ?  ? ?No Known Allergies  ? ?Outpatient Medications Prior to Visit  ?Medication Sig Dispense Refill  ? ACCU-CHEK AVIVA PLUS test strip     ? Accu-Chek Softclix Lancets lancets     ? Alcohol Swabs (B-D SINGLE USE SWABS REGULAR) PADS     ? Cholecalciferol (VITAMIN D-3 PO) Take by mouth.    ? DROPLET PEN NEEDLES 31G X 8 MM MISC     ? gabapentin (NEURONTIN) 300 MG capsule  Take 300 mg by mouth 3 (three) times daily. Take '300mg'$  every morning and '600mg'$  every night    ? glimepiride (AMARYL) 4 MG tablet Take 4 mg by mouth 2 (two) times daily.    ? hydrochlorothiazide (HYDRODIURIL) 25 MG tablet Take 25 mg by mouth daily.    ? IRON PO Take by mouth.    ? LANTUS SOLOSTAR 100 UNIT/ML Solostar Pen     ? lisinopril (PRINIVIL,ZESTRIL) 40 MG tablet TAKE ONE TABLET BY MOUTH EVERY DAY IN THE MORNING 30 tablet 6  ? lovastatin (MEVACOR) 40 MG tablet Take 40 mg by mouth at bedtime.    ? metFORMIN (GLUCOPHAGE) 1000 MG tablet TAKE ONE TABLET BY MOUTH TWICE DAILY 60 tablet 1  ? metFORMIN (GLUCOPHAGE-XR) 500 MG 24 hr tablet 2 tablets    ? metoprolol succinate (TOPROL-XL) 50 MG 24 hr tablet Take 50 mg by mouth daily. Take with or immediately following a meal.    ? sitaGLIPtin (JANUVIA) 100 MG tablet Take 100 mg by mouth daily.    ? Travoprost, BAK Free, (TRAVATAN) 0.004 % SOLN ophthalmic solution     ? vitamin B-12 (CYANOCOBALAMIN) 1000 MCG tablet Take 1,000 mcg by mouth daily.    ? ?No facility-administered medications prior to visit.  ? ?Review of Systems  ?Constitutional:  Negative for chills, fever, malaise/fatigue and weight loss.  ?HENT:  Negative for congestion, sinus pain and sore throat.   ?Eyes: Negative.   ?Respiratory:  Positive for cough, shortness of breath and wheezing. Negative for hemoptysis and sputum production.   ?Cardiovascular:  Negative for chest pain, palpitations, orthopnea, claudication and leg swelling.  ?Gastrointestinal:  Negative for abdominal pain, heartburn, nausea and vomiting.  ?Genitourinary: Negative.   ?Musculoskeletal:  Negative for joint pain and myalgias.  ?Skin:  Negative for rash.  ?Neurological:  Negative for weakness.  ?Endo/Heme/Allergies: Negative.   ?Psychiatric/Behavioral: Negative.    ? ?Objective:  ? ?Vitals:  ? 04/17/22 0948  ?BP: 138/78  ?Pulse: 66  ?Temp: 97.6 ?F (36.4 ?C)  ?TempSrc: Oral  ?SpO2: 99%  ?Weight: 196 lb (88.9 kg)  ?Height: 5' 5.5" (1.664 m)   ? ? ?Physical Exam ?Constitutional:   ?   General: She is not in acute distress. ?   Appearance: She is not ill-appearing.  ?HENT:  ?   Head: Normocephalic and atraumatic.  ?Eyes:  ?   General: No scleral icterus. ?   Conjunctiva/sclera: Conjunctivae normal.  ?  Pupils: Pupils are equal, round, and reactive to light.  ?Cardiovascular:  ?   Rate and Rhythm: Normal rate and regular rhythm.  ?   Pulses: Normal pulses.  ?   Heart sounds: Normal heart sounds. No murmur heard. ?Pulmonary:  ?   Effort: Pulmonary effort is normal.  ?   Breath sounds: Normal breath sounds. No wheezing, rhonchi or rales.  ?Abdominal:  ?   General: Bowel sounds are normal.  ?   Palpations: Abdomen is soft.  ?Musculoskeletal:  ?   Right lower leg: No edema.  ?   Left lower leg: No edema.  ?Lymphadenopathy:  ?   Cervical: No cervical adenopathy.  ?Skin: ?   General: Skin is warm and dry.  ?Neurological:  ?   General: No focal deficit present.  ?   Mental Status: She is alert.  ?Psychiatric:     ?   Mood and Affect: Mood normal.     ?   Behavior: Behavior normal.     ?   Thought Content: Thought content normal.     ?   Judgment: Judgment normal.  ? ?CBC ?   ?Component Value Date/Time  ? WBC 8.1 08/21/2018 1209  ? RBC 4.10 08/21/2018 1209  ? HGB 13.6 01/10/2019 1614  ? HCT 40.0 01/10/2019 1614  ? PLT 345 08/21/2018 1209  ? MCV 95.4 08/21/2018 1209  ? MCH 29.3 08/21/2018 1209  ? MCHC 30.7 08/21/2018 1209  ? RDW 12.5 08/21/2018 1209  ? LYMPHSABS 2.3 10/26/2010 2147  ? MONOABS 0.6 10/26/2010 2147  ? EOSABS 0.1 10/26/2010 2147  ? BASOSABS 0.1 10/26/2010 2147  ? ? ?  Latest Ref Rng & Units 01/10/2019  ?  4:14 PM 08/21/2018  ? 12:09 PM 11/26/2016  ?  7:29 PM  ?BMP  ?Glucose 70 - 99 mg/dL 224   204   275    ?BUN 8 - 23 mg/dL '20   21   17    '$ ?Creatinine 0.44 - 1.00 mg/dL 1.20   1.07   1.10    ?Sodium 135 - 145 mmol/L 138   142   139    ?Potassium 3.5 - 5.1 mmol/L 4.6   4.9   4.6    ?Chloride 98 - 111 mmol/L 99   105   99    ?CO2 22 - 32 mmol/L  31      ?Calcium 8.9 - 10.3 mg/dL  9.5     ? ?Chest imaging: ?CT Chest 06/02/21 ?Mediastinum/Nodes: Again identified well-defined low-attenuation ?mass in anterior mediastinum with maximal dimensions of 5.8 cm AP

## 2022-04-17 NOTE — Patient Instructions (Addendum)
Continue trelegy ellipta 1 puff daily ?- rinse mouth out after each use ? ?Use albuterol inhaler 1-2 puffs every 4-6 hours as needed.  ? ?Follow up in 4 months with pulmonary function tests ?

## 2022-04-19 ENCOUNTER — Ambulatory Visit (INDEPENDENT_AMBULATORY_CARE_PROVIDER_SITE_OTHER): Payer: Medicare HMO | Admitting: Pulmonary Disease

## 2022-04-19 DIAGNOSIS — J449 Chronic obstructive pulmonary disease, unspecified: Secondary | ICD-10-CM | POA: Diagnosis not present

## 2022-04-19 LAB — PULMONARY FUNCTION TEST
DL/VA % pred: 135 %
DL/VA: 5.52 ml/min/mmHg/L
DLCO cor % pred: 83 %
DLCO cor: 16.53 ml/min/mmHg
DLCO unc % pred: 83 %
DLCO unc: 16.53 ml/min/mmHg
FEF 25-75 Post: 0.56 L/sec
FEF 25-75 Pre: 0.97 L/sec
FEF2575-%Change-Post: -41 %
FEF2575-%Pred-Post: 35 %
FEF2575-%Pred-Pre: 61 %
FEV1-%Change-Post: -12 %
FEV1-%Pred-Post: 56 %
FEV1-%Pred-Pre: 64 %
FEV1-Post: 1.01 L
FEV1-Pre: 1.15 L
FEV1FVC-%Change-Post: -4 %
FEV1FVC-%Pred-Pre: 101 %
FEV6-%Change-Post: -8 %
FEV6-%Pred-Post: 61 %
FEV6-%Pred-Pre: 66 %
FEV6-Post: 1.36 L
FEV6-Pre: 1.48 L
FEV6FVC-%Pred-Post: 104 %
FEV6FVC-%Pred-Pre: 104 %
FVC-%Change-Post: -8 %
FVC-%Pred-Post: 58 %
FVC-%Pred-Pre: 63 %
FVC-Post: 1.36 L
FVC-Pre: 1.48 L
Post FEV1/FVC ratio: 74 %
Post FEV6/FVC ratio: 100 %
Pre FEV1/FVC ratio: 78 %
Pre FEV6/FVC Ratio: 100 %
RV % pred: 111 %
RV: 2.6 L
TLC % pred: 80 %
TLC: 4.2 L

## 2022-04-19 NOTE — Progress Notes (Signed)
Full PFT performed today. °

## 2022-04-19 NOTE — Patient Instructions (Signed)
Full PFT performed today. °

## 2022-05-09 ENCOUNTER — Other Ambulatory Visit: Payer: Self-pay | Admitting: *Deleted

## 2022-05-09 DIAGNOSIS — E328 Other diseases of thymus: Secondary | ICD-10-CM

## 2022-05-17 ENCOUNTER — Ambulatory Visit
Admission: RE | Admit: 2022-05-17 | Discharge: 2022-05-17 | Disposition: A | Discharge status: Home / Self Care | Payer: Medicare HMO | Source: Ambulatory Visit | Attending: Internal Medicine | Admitting: Internal Medicine

## 2022-05-17 DIAGNOSIS — Z1231 Encounter for screening mammogram for malignant neoplasm of breast: Secondary | ICD-10-CM | POA: Diagnosis not present

## 2022-06-05 ENCOUNTER — Ambulatory Visit
Admission: RE | Admit: 2022-06-05 | Discharge: 2022-06-05 | Disposition: A | Discharge status: Home / Self Care | Payer: Medicare HMO | Source: Ambulatory Visit | Attending: Thoracic Surgery (Cardiothoracic Vascular Surgery) | Admitting: Thoracic Surgery (Cardiothoracic Vascular Surgery)

## 2022-06-05 DIAGNOSIS — R911 Solitary pulmonary nodule: Secondary | ICD-10-CM | POA: Diagnosis not present

## 2022-06-05 DIAGNOSIS — E328 Other diseases of thymus: Secondary | ICD-10-CM

## 2022-06-08 ENCOUNTER — Ambulatory Visit (INDEPENDENT_AMBULATORY_CARE_PROVIDER_SITE_OTHER): Payer: Medicare HMO | Admitting: Thoracic Surgery (Cardiothoracic Vascular Surgery)

## 2022-06-08 DIAGNOSIS — E328 Other diseases of thymus: Secondary | ICD-10-CM

## 2022-06-08 NOTE — Progress Notes (Signed)
     [address]       [place] [address]             [phone]       Patient: Home Provider: Office Consent for Telemedicine visit obtained.  Today's visit was completed via a real-time telehealth (see specific modality noted below). The patient/authorized person provided oral consent at the time of the visit to engage in a telemedicine encounter with the present provider at [facility]. The patient/authorized person was informed of the potential benefits, limitations, and risks of telemedicine. The patient/authorized person expressed understanding that the laws that protect confidentiality also apply to telemedicine. The patient/authorized person acknowledged understanding that telemedicine does not provide emergency services and that he or she would need to call 911 or proceed to the nearest hospital for help if such a need arose.   Total time spent in the clinical discussion 10 minutes.  Telehealth Modality: Phone visit (audio only)  I had a telephone visit with Jessica Jacobs.  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  She still does not want any surgical intervention.  I reviewed her cross-sectional imaging today and those are stable.  She will call us back if she would like to proceed with further imaging.   Marland Kitchen

## 2022-06-20 ENCOUNTER — Ambulatory Visit (INDEPENDENT_AMBULATORY_CARE_PROVIDER_SITE_OTHER): Payer: Medicare HMO | Admitting: Podiatry

## 2022-06-20 ENCOUNTER — Encounter: Payer: Self-pay | Admitting: Podiatry

## 2022-06-20 DIAGNOSIS — B351 Tinea unguium: Secondary | ICD-10-CM

## 2022-06-20 DIAGNOSIS — M79675 Pain in left toe(s): Secondary | ICD-10-CM | POA: Diagnosis not present

## 2022-06-20 DIAGNOSIS — M79674 Pain in right toe(s): Secondary | ICD-10-CM

## 2022-06-20 DIAGNOSIS — M2010 Hallux valgus (acquired), unspecified foot: Secondary | ICD-10-CM

## 2022-06-20 DIAGNOSIS — E119 Type 2 diabetes mellitus without complications: Secondary | ICD-10-CM

## 2022-06-20 NOTE — Progress Notes (Signed)
This patient returns to my office for at risk foot care.  This patient requires this care by a professional since this patient will be at risk due to having  Diabetes type 2.  This patient is unable to cut nails herself since the patient cannot reach her nails.These nails are painful walking and wearing shoes.  This patient presents for at risk foot care today.  General Appearance  Alert, conversant and in no acute stress.  Vascular  Dorsalis pedis and posterior tibial  pulses are weakly  palpable  bilaterally.  Capillary return is within normal limits  bilaterally. Temperature is within normal limits  Bilaterally. Absent hair.  Neurologic  Senn-Weinstein monofilament wire test within normal limits  bilaterally. Muscle power within normal limits bilaterally.  Nails Thick disfigured discolored nails with subungual debris  from hallux to fifth toes bilaterally. No evidence of bacterial infection or drainage bilaterally.  Orthopedic  No limitations of motion  feet .  No crepitus or effusions noted.  No bony pathology or digital deformities noted.  HAV  B/L.  Skin  normotropic skin with no porokeratosis noted bilaterally.  No signs of infections or ulcers noted.     Onychomycosis  Pain in right toes  Pain in left toes  Consent was obtained for treatment procedures.   Mechanical debridement of nails 1-5  bilaterally performed with a nail nipper.  Filed with dremel without incident.     Return office visit    3 months                  Told patient to return for periodic foot care and evaluation due to potential at risk complications.   Jaala Bohle DPM  

## 2022-08-27 ENCOUNTER — Encounter: Payer: Self-pay | Admitting: Pulmonary Disease

## 2022-08-27 ENCOUNTER — Ambulatory Visit (INDEPENDENT_AMBULATORY_CARE_PROVIDER_SITE_OTHER): Payer: Medicare HMO | Admitting: Pulmonary Disease

## 2022-08-27 VITALS — BP 114/74 | HR 60 | Temp 98.0°F | Ht 65.5 in | Wt 197.8 lb

## 2022-08-27 DIAGNOSIS — J449 Chronic obstructive pulmonary disease, unspecified: Secondary | ICD-10-CM

## 2022-08-27 DIAGNOSIS — Z87891 Personal history of nicotine dependence: Secondary | ICD-10-CM

## 2022-08-27 NOTE — Patient Instructions (Signed)
Continue trelegy ellipta 1 puff daily - rinse mouth out after each use  Continue albuterol inhaler 1-2 puffs every 4-6 hours as needed  Follow up with our lung cancer screening team as scheduled  Follow up in 1 year

## 2022-08-27 NOTE — Progress Notes (Signed)
Synopsis: Referred in April 2023 for COPD by Seward Carol, MD  Subjective:   PATIENT ID: Jessica Jacobs GENDER: female DOB: 11-Sep-1946, MRN: [id number]  HPI  Chief Complaint  Patient presents with   Follow-up    Breathing is overall doing well. She uses her albuterol inhaler 3-4 x per day on average.    Jessica Jacobs is a 76 year old woman, former smoker with DM II who returns to pulmonary clinic for COPD.   She has been doing well on trelegy ellipta 1 puff daily and using albuterol as needed 3-4 times per day.   PFTs 03/2022 showed mild restrictive defect.   She reports her breathing is stable since last visit.  Initial OV 04/17/22 She reports having dyspnea after walking 1 block. She also has cough and wheezing with the dyspnea. This has been occurring over the past year. Her symptoms improved with trelegy inhaler from her primary care doctor.   She quit smoking 5-6 years ago. She smoked for 30-40 years, about 1 pack per day. She is retired and worked as a Chartered certified accountant.   She denies issues with GERD. She has some seasonal allergies during the spring and summer. Hot humid days bother her breathing.   No family history of lung disease.  Past Medical History:  Diagnosis Date   Anemia    BACK PAIN, LUMBAR 12/07/2008   Qualifier: Diagnosis of  By: Radene Ou MD, [place]     Cellulitis and abscess of other specified site 01/26/2008   Qualifier: Diagnosis of  By: Radene Ou MD, Octavia Bruckner, PARTIAL, WITH ANASTOMOSIS, HX OF 08/26/2007   Qualifier: Diagnosis of  By: Radene Ou MD, [place]     Diabetes mellitus without complication ([place])    DIABETES MELLITUS, TYPE II 08/26/2007   Qualifier: Diagnosis of  By: Radene Ou MD, Loman Chroman, HX OF 08/26/2007   Qualifier: Diagnosis of  By: Radene Ou MD, Champ Mungo POSITIVE STOOL 10/06/2010   Annotation: Dr. Velora Heckler rec 10.2011:  repeat hemoccults; if + and anemic get  EGD Qualifier: Diagnosis of  By: Jorene Minors,  [place]     HEADACHE 11/05/2007   Qualifier: Diagnosis of  By: Radene Ou MD, Rexanne Mano 08/26/2007   Qualifier: Diagnosis of  By: Radene Ou MD, [place]     Hypertension    OBESITY 02/22/2011   Qualifier: Diagnosis of  By: Hassell Done FNP, Tori Milks     ONYCHOMYCOSIS 10/26/2009   Qualifier: Diagnosis of  By: Jorene Minors, Scott     PHARYNGITIS 12/21/2010   Qualifier: Diagnosis of  By: Hassell Done FNP, [place]     SEBACEOUS CYST, INFECTED 01/26/2008   Qualifier: Diagnosis of  By: Radene Ou MD, [place]     SKIN TAG 12/19/2009   Qualifier: Diagnosis of  By: Jorene Minors, Scott     URI 08/10/2008   Qualifier: Diagnosis of  By: Radene Ou MD, Amaryllis Dyke, CANDIDAL 02/28/2010   Qualifier: Diagnosis of  By: Jorene Minors, Scott     WEIGHT GAIN 06/15/2008   Qualifier: Diagnosis of  By: Radene Ou MD, [place]       Family History  Problem Relation Age of Onset   Cirrhosis Father    Breast cancer Neg Hx      Social History   Socioeconomic History   Marital status: Single    Spouse name: Not on file   Number of children: Not on file   Years of education:  Not on file   Highest education level: Not on file  Occupational History   Not on file  Tobacco Use   Smoking status: Former    Packs/day: 1.50    Years: 40.00    Total pack years: 60.00    Types: Cigarettes    Quit date: 12/31/2012    Years since quitting: 9.6   Smokeless tobacco: Never  Vaping Use   Vaping Use: Never used  Substance and Sexual Activity   Alcohol use: No   Drug use: Yes    Types: Marijuana   Sexual activity: Never    Birth control/protection: Surgical  Other Topics Concern   Not on file  Social History Narrative   Not on file   Social Determinants of Health   Financial Resource Strain: Not on file  Food Insecurity: Not on file  Transportation Needs: Not on file  Physical Activity: Not on file  Stress: Not on file  Social Connections: Not on file  Intimate Partner Violence: Not on file     No  Known Allergies   Outpatient Medications Prior to Visit  Medication Sig Dispense Refill   ACCU-CHEK AVIVA PLUS test strip      Accu-Chek Softclix Lancets lancets      albuterol (VENTOLIN HFA) 108 (90 Base) MCG/ACT inhaler Inhale 2 puffs into the lungs every 6 (six) hours as needed for wheezing or shortness of breath. 8 g 6   Alcohol Swabs (B-D SINGLE USE SWABS REGULAR) PADS      Cholecalciferol (VITAMIN D-3 PO) Take by mouth.     DROPLET PEN NEEDLES 31G X 8 MM MISC      gabapentin (NEURONTIN) 300 MG capsule Take 300 mg by mouth 3 (three) times daily. Take '300mg'$  every morning and '600mg'$  every night     glimepiride (AMARYL) 4 MG tablet Take 4 mg by mouth 2 (two) times daily.     hydrochlorothiazide (HYDRODIURIL) 25 MG tablet Take 25 mg by mouth daily.     IRON PO Take by mouth.     LANTUS SOLOSTAR 100 UNIT/ML Solostar Pen      lisinopril (PRINIVIL,ZESTRIL) 40 MG tablet TAKE ONE TABLET BY MOUTH EVERY DAY IN THE MORNING 30 tablet 6   lovastatin (MEVACOR) 40 MG tablet Take 40 mg by mouth at bedtime.     metFORMIN (GLUCOPHAGE-XR) 500 MG 24 hr tablet 2 tablets     metoprolol succinate (TOPROL-XL) 50 MG 24 hr tablet Take 50 mg by mouth daily. Take with or immediately following a meal.     sitaGLIPtin (JANUVIA) 100 MG tablet Take 100 mg by mouth daily.     Travoprost, BAK Free, (TRAVATAN) 0.004 % SOLN ophthalmic solution      TRELEGY ELLIPTA 100-62.5-25 MCG/ACT AEPB Inhale 1 puff into the lungs daily.     vitamin B-12 (CYANOCOBALAMIN) 1000 MCG tablet Take 1,000 mcg by mouth daily.     metFORMIN (GLUCOPHAGE) 1000 MG tablet TAKE ONE TABLET BY MOUTH TWICE DAILY 60 tablet 1   No facility-administered medications prior to visit.   Review of Systems  Constitutional:  Negative for chills, fever, malaise/fatigue and weight loss.  HENT:  Negative for congestion, sinus pain and sore throat.   Eyes: Negative.   Respiratory:  Positive for shortness of breath. Negative for cough, hemoptysis, sputum  production and wheezing.   Cardiovascular:  Negative for chest pain, palpitations, orthopnea, claudication and leg swelling.  Gastrointestinal:  Negative for abdominal pain, heartburn, nausea and vomiting.  Genitourinary: Negative.  Musculoskeletal:  Negative for joint pain and myalgias.  Skin:  Negative for rash.  Neurological:  Negative for weakness.  Endo/Heme/Allergies: Negative.   Psychiatric/Behavioral: Negative.      Objective:   Vitals:   08/27/22 1104  BP: 114/74  Pulse: 60  Temp: 98 F (36.7 C)  TempSrc: Oral  SpO2: 96%  Weight: 197 lb 12.8 oz (89.7 kg)  Height: 5' 5.5" (1.664 m)    Physical Exam Constitutional:      General: She is not in acute distress.    Appearance: She is not ill-appearing.  HENT:     Head: Normocephalic and atraumatic.  Eyes:     General: No scleral icterus. Cardiovascular:     Rate and Rhythm: Normal rate and regular rhythm.     Pulses: Normal pulses.     Heart sounds: Normal heart sounds. No murmur heard. Pulmonary:     Effort: Pulmonary effort is normal.     Breath sounds: Normal breath sounds. No wheezing, rhonchi or rales.  Musculoskeletal:     Right lower leg: No edema.     Left lower leg: No edema.  Skin:    General: Skin is warm and dry.  Neurological:     General: No focal deficit present.     Mental Status: She is alert.  Psychiatric:        Mood and Affect: Mood normal.        Behavior: Behavior normal.        Thought Content: Thought content normal.        Judgment: Judgment normal.    CBC    Component Value Date/Time   WBC 8.1 08/21/2018 1209   RBC 4.10 08/21/2018 1209   HGB 13.6 01/10/2019 1614   HCT 40.0 01/10/2019 1614   PLT 345 08/21/2018 1209   MCV 95.4 08/21/2018 1209   MCH 29.3 08/21/2018 1209   MCHC 30.7 08/21/2018 1209   RDW 12.5 08/21/2018 1209   LYMPHSABS 2.3 10/26/2010 2147   MONOABS 0.6 10/26/2010 2147   EOSABS 0.1 10/26/2010 2147   BASOSABS 0.1 10/26/2010 2147      Latest Ref Rng &  Units 01/10/2019    4:14 PM 08/21/2018   12:09 PM 11/26/2016    7:29 PM  BMP  Glucose 70 - 99 mg/dL 224  204  275   BUN 8 - 23 mg/dL '20  21  17   '$ Creatinine 0.44 - 1.00 mg/dL 1.20  1.07  1.10   Sodium 135 - 145 mmol/L 138  142  139   Potassium 3.5 - 5.1 mmol/L 4.6  4.9  4.6   Chloride 98 - 111 mmol/L 99  105  99   CO2 22 - 32 mmol/L  31    Calcium 8.9 - 10.3 mg/dL  9.5     Chest imaging: CT Chest 06/05/22 1. 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 2. 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 3. Aortic Atherosclerosis (ICD10-I70.0). Coronary atherosclerosis. Mild cardiomegaly.  CT Chest 06/02/21 Mediastinum/Nodes: Again identified well-defined low-attenuation mass in anterior mediastinum with maximal dimensions of 5.8 cm AP, 5.9 cm transverse, and 7.3 cm craniocaudal, unchanged. No calcifications or definite soft tissue components. 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 No thoracic adenopathy. Esophagus unremarkable. Base of cervical region unremarkable.   Lungs/Pleura:  Minimal atelectasis at lateral LEFT lower lobe base and at anterior base of lingula. Lungs otherwise clear. No infiltrate, pleural effusion, or pneumothorax. Tiny subpleural nodule LEFT lower lobe 3 mm image 93 unchanged. Additional area of subpleural thickening LEFT lower lobe 6 mm diameter image 82 unchanged.  PFT:    Latest Ref Rng & Units 04/19/2022    9:53 AM  PFT Results  FVC-Pre L 1.48   FVC-Predicted Pre % 63   FVC-Post L 1.36   FVC-Predicted Post % 58   Pre FEV1/FVC % % 78   Post FEV1/FCV % % 74   FEV1-Pre L 1.15   FEV1-Predicted Pre % 64   FEV1-Post L 1.01   DLCO uncorrected ml/min/mmHg 16.53   DLCO UNC% % 83   DLCO corrected ml/min/mmHg 16.53   DLCO COR %Predicted  % 83   DLVA Predicted % 135   TLC L 4.20   TLC % Predicted % 80   RV % Predicted % 111    Labs:  Path:  Echo 06/03/19: LV EF 55-60%. RV size and systolic function are normal. LV mildly dilated.   Heart Catheterization:  Assessment & Plan:   Chronic obstructive pulmonary disease, unspecified COPD type ([place])  Former smoker  Discussion: Nguyet Mercer is a 76 year old woman, former smoker with DM II who returns to pulmonary clinic for COPD.   She is to continue trelegy ellipta 1 puff daily. She is to use albuterol inhaler 1-2 puffs every 4-6 hours as needed.   She has been referred to lung cancer screening team.  Follow up in 1 year.  Freda Jackson, MD [facility] Office: [phone]    Current Outpatient Medications:    ACCU-CHEK AVIVA PLUS test strip, , Disp: , Rfl:    Accu-Chek Softclix Lancets lancets, , Disp: , Rfl:    albuterol (VENTOLIN HFA) 108 (90 Base) MCG/ACT inhaler, Inhale 2 puffs into the lungs every 6 (six) hours as needed for wheezing or shortness of breath., Disp: 8 g, Rfl: 6   Alcohol Swabs (B-D SINGLE USE SWABS REGULAR) PADS, , Disp: , Rfl:    Cholecalciferol (VITAMIN D-3 PO), Take by mouth., Disp: , Rfl:    DROPLET PEN NEEDLES 31G X 8 MM MISC, , Disp: , Rfl:    gabapentin (NEURONTIN) 300 MG capsule, Take 300 mg by mouth 3 (three) times daily. Take '300mg'$  every morning and '600mg'$  every night, Disp: , Rfl:    glimepiride (AMARYL) 4 MG tablet, Take 4 mg by mouth 2 (two) times daily., Disp: , Rfl:    hydrochlorothiazide (HYDRODIURIL) 25 MG tablet, Take 25 mg by mouth daily., Disp: , Rfl:    IRON PO, Take by mouth., Disp: , Rfl:    LANTUS SOLOSTAR 100 UNIT/ML Solostar Pen, , Disp: , Rfl:    lisinopril (PRINIVIL,ZESTRIL) 40 MG tablet, TAKE ONE TABLET BY MOUTH EVERY DAY IN THE MORNING, Disp: 30 tablet, Rfl: 6   lovastatin (MEVACOR) 40 MG tablet, Take 40 mg by mouth at bedtime., Disp: , Rfl:    metFORMIN (GLUCOPHAGE-XR) 500 MG 24 hr  tablet, 2 tablets, Disp: , Rfl:    metoprolol succinate (TOPROL-XL) 50 MG 24 hr tablet, Take 50 mg by mouth daily. Take with or immediately following a meal., Disp: , Rfl:    sitaGLIPtin (JANUVIA) 100 MG tablet, Take 100 mg by mouth daily., Disp: , Rfl:    Travoprost, BAK Free, (TRAVATAN) 0.004 % SOLN ophthalmic solution, , Disp: , Rfl:    TRELEGY  ELLIPTA 100-62.5-25 MCG/ACT AEPB, Inhale 1 puff into the lungs daily., Disp: , Rfl:    vitamin B-12 (CYANOCOBALAMIN) 1000 MCG tablet, Take 1,000 mcg by mouth daily., Disp: , Rfl:

## 2022-09-21 ENCOUNTER — Ambulatory Visit: Payer: Medicare HMO | Admitting: Podiatry

## 2022-09-21 ENCOUNTER — Encounter: Payer: Self-pay | Admitting: Podiatry

## 2022-09-21 ENCOUNTER — Ambulatory Visit (INDEPENDENT_AMBULATORY_CARE_PROVIDER_SITE_OTHER): Payer: Medicare HMO | Admitting: Podiatry

## 2022-09-21 DIAGNOSIS — E119 Type 2 diabetes mellitus without complications: Secondary | ICD-10-CM

## 2022-09-21 DIAGNOSIS — M79674 Pain in right toe(s): Secondary | ICD-10-CM | POA: Diagnosis not present

## 2022-09-21 DIAGNOSIS — B351 Tinea unguium: Secondary | ICD-10-CM

## 2022-09-21 DIAGNOSIS — M79675 Pain in left toe(s): Secondary | ICD-10-CM | POA: Diagnosis not present

## 2022-09-21 DIAGNOSIS — M2010 Hallux valgus (acquired), unspecified foot: Secondary | ICD-10-CM | POA: Diagnosis not present

## 2022-09-21 NOTE — Progress Notes (Signed)
This patient returns to my office for at risk foot care.  This patient requires this care by a professional since this patient will be at risk due to having  Diabetes type 2.  This patient is unable to cut nails herself since the patient cannot reach her nails.These nails are painful walking and wearing shoes.  This patient presents for at risk foot care today.  General Appearance  Alert, conversant and in no acute stress.  Vascular  Dorsalis pedis and posterior tibial  pulses are weakly  palpable  bilaterally.  Capillary return is within normal limits  bilaterally. Temperature is within normal limits  Bilaterally. Absent hair.  Neurologic  Senn-Weinstein monofilament wire test within normal limits  bilaterally. Muscle power within normal limits bilaterally.  Nails Thick disfigured discolored nails with subungual debris  from hallux to fifth toes bilaterally. No evidence of bacterial infection or drainage bilaterally.  Orthopedic  No limitations of motion  feet .  No crepitus or effusions noted.  No bony pathology or digital deformities noted.  HAV  B/L.  Skin  normotropic skin with no porokeratosis noted bilaterally.  No signs of infections or ulcers noted.     Onychomycosis  Pain in right toes  Pain in left toes  Consent was obtained for treatment procedures.   Mechanical debridement of nails 1-5  bilaterally performed with a nail nipper.  Filed with dremel without incident.  After providing nail service she remarked are you speaking Spanish to me.   Return office visit    3 months                  Told patient to return for periodic foot care and evaluation due to potential at risk complications.   Gardiner Barefoot DPM

## 2022-09-26 DIAGNOSIS — L989 Disorder of the skin and subcutaneous tissue, unspecified: Secondary | ICD-10-CM | POA: Diagnosis not present

## 2022-09-26 DIAGNOSIS — I10 Essential (primary) hypertension: Secondary | ICD-10-CM | POA: Diagnosis not present

## 2022-09-26 DIAGNOSIS — Z1331 Encounter for screening for depression: Secondary | ICD-10-CM | POA: Diagnosis not present

## 2022-09-26 DIAGNOSIS — Z0000 Encounter for general adult medical examination without abnormal findings: Secondary | ICD-10-CM | POA: Diagnosis not present

## 2022-09-26 DIAGNOSIS — E1122 Type 2 diabetes mellitus with diabetic chronic kidney disease: Secondary | ICD-10-CM | POA: Diagnosis not present

## 2022-09-26 DIAGNOSIS — E0840 Diabetes mellitus due to underlying condition with diabetic neuropathy, unspecified: Secondary | ICD-10-CM | POA: Diagnosis not present

## 2022-09-26 DIAGNOSIS — N1831 Chronic kidney disease, stage 3a: Secondary | ICD-10-CM | POA: Diagnosis not present

## 2022-09-26 DIAGNOSIS — Z794 Long term (current) use of insulin: Secondary | ICD-10-CM | POA: Diagnosis not present

## 2022-09-26 DIAGNOSIS — J449 Chronic obstructive pulmonary disease, unspecified: Secondary | ICD-10-CM | POA: Diagnosis not present

## 2022-09-26 DIAGNOSIS — I700 Atherosclerosis of aorta: Secondary | ICD-10-CM | POA: Diagnosis not present

## 2022-09-26 DIAGNOSIS — E328 Other diseases of thymus: Secondary | ICD-10-CM | POA: Diagnosis not present

## 2022-10-12 DIAGNOSIS — D649 Anemia, unspecified: Secondary | ICD-10-CM | POA: Diagnosis not present

## 2022-10-23 DIAGNOSIS — I10 Essential (primary) hypertension: Secondary | ICD-10-CM | POA: Diagnosis not present

## 2022-10-23 DIAGNOSIS — E1169 Type 2 diabetes mellitus with other specified complication: Secondary | ICD-10-CM | POA: Diagnosis not present

## 2022-10-23 DIAGNOSIS — E7800 Pure hypercholesterolemia, unspecified: Secondary | ICD-10-CM | POA: Diagnosis not present

## 2022-10-23 DIAGNOSIS — N1831 Chronic kidney disease, stage 3a: Secondary | ICD-10-CM | POA: Diagnosis not present

## 2022-11-07 DIAGNOSIS — H52223 Regular astigmatism, bilateral: Secondary | ICD-10-CM | POA: Diagnosis not present

## 2022-11-07 DIAGNOSIS — H5203 Hypermetropia, bilateral: Secondary | ICD-10-CM | POA: Diagnosis not present

## 2022-11-07 DIAGNOSIS — H524 Presbyopia: Secondary | ICD-10-CM | POA: Diagnosis not present

## 2022-12-21 ENCOUNTER — Encounter: Payer: Self-pay | Admitting: Podiatry

## 2022-12-21 ENCOUNTER — Ambulatory Visit (INDEPENDENT_AMBULATORY_CARE_PROVIDER_SITE_OTHER): Payer: Medicare HMO | Admitting: Podiatry

## 2022-12-21 DIAGNOSIS — M79674 Pain in right toe(s): Secondary | ICD-10-CM | POA: Diagnosis not present

## 2022-12-21 DIAGNOSIS — B351 Tinea unguium: Secondary | ICD-10-CM

## 2022-12-21 DIAGNOSIS — E119 Type 2 diabetes mellitus without complications: Secondary | ICD-10-CM

## 2022-12-21 DIAGNOSIS — M79675 Pain in left toe(s): Secondary | ICD-10-CM

## 2022-12-21 DIAGNOSIS — M2010 Hallux valgus (acquired), unspecified foot: Secondary | ICD-10-CM

## 2022-12-21 NOTE — Progress Notes (Signed)
This patient returns to my office for at risk foot care.  This patient requires this care by a professional since this patient will be at risk due to having  Diabetes type 2.  This patient is unable to cut nails herself since the patient cannot reach her nails.These nails are painful walking and wearing shoes.  This patient presents for at risk foot care today.  General Appearance  Alert, conversant and in no acute stress.  Vascular  Dorsalis pedis and posterior tibial  pulses are weakly  palpable  bilaterally.  Capillary return is within normal limits  bilaterally. Temperature is within normal limits  Bilaterally. Absent hair.  Neurologic  Senn-Weinstein monofilament wire test within normal limits  bilaterally. Muscle power within normal limits bilaterally.  Nails Thick disfigured discolored nails with subungual debris  from hallux to fifth toes bilaterally. No evidence of bacterial infection or drainage bilaterally.  Orthopedic  No limitations of motion  feet .  No crepitus or effusions noted.  No bony pathology or digital deformities noted.  HAV  B/L.  Skin  normotropic skin with no porokeratosis noted bilaterally.  No signs of infections or ulcers noted.     Onychomycosis  Pain in right toes  Pain in left toes  Consent was obtained for treatment procedures.   Mechanical debridement of nails 1-5  bilaterally performed with a nail nipper.  Filed with dremel without incident.     Return office visit    3 months                  Told patient to return for periodic foot care and evaluation due to potential at risk complications.   Gardiner Barefoot DPM

## 2022-12-28 DIAGNOSIS — N1831 Chronic kidney disease, stage 3a: Secondary | ICD-10-CM | POA: Diagnosis not present

## 2022-12-28 DIAGNOSIS — E1169 Type 2 diabetes mellitus with other specified complication: Secondary | ICD-10-CM | POA: Diagnosis not present

## 2022-12-28 DIAGNOSIS — I10 Essential (primary) hypertension: Secondary | ICD-10-CM | POA: Diagnosis not present

## 2022-12-28 DIAGNOSIS — E7800 Pure hypercholesterolemia, unspecified: Secondary | ICD-10-CM | POA: Diagnosis not present

## 2023-01-03 DIAGNOSIS — E1122 Type 2 diabetes mellitus with diabetic chronic kidney disease: Secondary | ICD-10-CM | POA: Diagnosis not present

## 2023-01-22 DIAGNOSIS — H903 Sensorineural hearing loss, bilateral: Secondary | ICD-10-CM | POA: Diagnosis not present

## 2023-01-29 DIAGNOSIS — H903 Sensorineural hearing loss, bilateral: Secondary | ICD-10-CM | POA: Diagnosis not present

## 2023-01-31 DIAGNOSIS — E1169 Type 2 diabetes mellitus with other specified complication: Secondary | ICD-10-CM | POA: Diagnosis not present

## 2023-01-31 DIAGNOSIS — D485 Neoplasm of uncertain behavior of skin: Secondary | ICD-10-CM | POA: Diagnosis not present

## 2023-01-31 DIAGNOSIS — E119 Type 2 diabetes mellitus without complications: Secondary | ICD-10-CM | POA: Diagnosis not present

## 2023-01-31 DIAGNOSIS — B079 Viral wart, unspecified: Secondary | ICD-10-CM | POA: Diagnosis not present

## 2023-03-22 ENCOUNTER — Encounter: Payer: Self-pay | Admitting: Podiatry

## 2023-03-22 ENCOUNTER — Ambulatory Visit (INDEPENDENT_AMBULATORY_CARE_PROVIDER_SITE_OTHER): Payer: Medicare HMO | Admitting: Podiatry

## 2023-03-22 DIAGNOSIS — M79674 Pain in right toe(s): Secondary | ICD-10-CM

## 2023-03-22 DIAGNOSIS — E119 Type 2 diabetes mellitus without complications: Secondary | ICD-10-CM | POA: Diagnosis not present

## 2023-03-22 DIAGNOSIS — M79675 Pain in left toe(s): Secondary | ICD-10-CM

## 2023-03-22 DIAGNOSIS — B351 Tinea unguium: Secondary | ICD-10-CM | POA: Diagnosis not present

## 2023-03-22 DIAGNOSIS — M2010 Hallux valgus (acquired), unspecified foot: Secondary | ICD-10-CM

## 2023-03-22 NOTE — Addendum Note (Signed)
Addended by: Gardiner Barefoot on: 03/22/2023 09:45 AM   Modules accepted: Orders

## 2023-03-22 NOTE — Progress Notes (Addendum)
This patient returns to my office for at risk foot care.  This patient requires this care by a professional since this patient will be at risk due to having  Diabetes type 2.  This patient is unable to cut nails herself since the patient cannot reach her nails.These nails are painful walking and wearing shoes.  This patient presents for at risk foot care today.  General Appearance  Alert, conversant and in no acute stress.  Vascular  Dorsalis pedis and posterior tibial  pulses are weakly  palpable  bilaterally.  Capillary return is within normal limits  bilaterally. Temperature is within normal limits  Bilaterally. Absent hair.  Neurologic  Senn-Weinstein monofilament wire test within normal limits  bilaterally. Muscle power within normal limits bilaterally.  Nails Thick disfigured discolored nails with subungual debris  from hallux to fifth toes bilaterally. No evidence of bacterial infection or drainage bilaterally.  Orthopedic  No limitations of motion  feet .  No crepitus or effusions noted.  No bony pathology or digital deformities noted.  HAV  B/L.  Skin  normotropic skin with no porokeratosis noted bilaterally.  No signs of infections or ulcers noted.     Onychomycosis  Pain in right toes  Pain in left toes  Consent was obtained for treatment procedures.   Mechanical debridement of nails 1-5  bilaterally performed with a nail nipper.  Filed with dremel without incident.  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  To make an appointment with pedorthist.   Return office visit    3 months for RFC.                 Told patient to return for periodic foot care and evaluation due to potential at risk complications.   Gardiner Barefoot DPM

## 2023-03-25 DIAGNOSIS — E328 Other diseases of thymus: Secondary | ICD-10-CM | POA: Diagnosis not present

## 2023-03-25 DIAGNOSIS — E1165 Type 2 diabetes mellitus with hyperglycemia: Secondary | ICD-10-CM | POA: Diagnosis not present

## 2023-03-25 DIAGNOSIS — E7800 Pure hypercholesterolemia, unspecified: Secondary | ICD-10-CM | POA: Diagnosis not present

## 2023-03-25 DIAGNOSIS — I10 Essential (primary) hypertension: Secondary | ICD-10-CM | POA: Diagnosis not present

## 2023-03-25 DIAGNOSIS — J449 Chronic obstructive pulmonary disease, unspecified: Secondary | ICD-10-CM | POA: Diagnosis not present

## 2023-03-25 DIAGNOSIS — N1831 Chronic kidney disease, stage 3a: Secondary | ICD-10-CM | POA: Diagnosis not present

## 2023-03-25 DIAGNOSIS — I700 Atherosclerosis of aorta: Secondary | ICD-10-CM | POA: Diagnosis not present

## 2023-03-25 DIAGNOSIS — E1122 Type 2 diabetes mellitus with diabetic chronic kidney disease: Secondary | ICD-10-CM | POA: Diagnosis not present

## 2023-04-09 ENCOUNTER — Other Ambulatory Visit: Payer: Self-pay | Admitting: Internal Medicine

## 2023-04-09 DIAGNOSIS — Z1231 Encounter for screening mammogram for malignant neoplasm of breast: Secondary | ICD-10-CM

## 2023-05-14 DIAGNOSIS — M542 Cervicalgia: Secondary | ICD-10-CM | POA: Diagnosis not present

## 2023-05-20 ENCOUNTER — Ambulatory Visit
Admission: RE | Admit: 2023-05-20 | Discharge: 2023-05-20 | Disposition: A | Discharge status: Home / Self Care | Payer: Medicare HMO | Source: Ambulatory Visit | Attending: Internal Medicine | Admitting: Internal Medicine

## 2023-05-20 DIAGNOSIS — Z1231 Encounter for screening mammogram for malignant neoplasm of breast: Secondary | ICD-10-CM

## 2023-05-24 DIAGNOSIS — E7800 Pure hypercholesterolemia, unspecified: Secondary | ICD-10-CM | POA: Diagnosis not present

## 2023-05-24 DIAGNOSIS — I10 Essential (primary) hypertension: Secondary | ICD-10-CM | POA: Diagnosis not present

## 2023-05-24 DIAGNOSIS — E1122 Type 2 diabetes mellitus with diabetic chronic kidney disease: Secondary | ICD-10-CM | POA: Diagnosis not present

## 2023-06-13 ENCOUNTER — Other Ambulatory Visit: Payer: Self-pay

## 2023-06-13 DIAGNOSIS — Z122 Encounter for screening for malignant neoplasm of respiratory organs: Secondary | ICD-10-CM

## 2023-06-13 DIAGNOSIS — Z87891 Personal history of nicotine dependence: Secondary | ICD-10-CM

## 2023-06-24 ENCOUNTER — Ambulatory Visit (INDEPENDENT_AMBULATORY_CARE_PROVIDER_SITE_OTHER): Payer: Medicare HMO | Admitting: Podiatry

## 2023-06-24 ENCOUNTER — Encounter: Payer: Self-pay | Admitting: Podiatry

## 2023-06-24 DIAGNOSIS — E119 Type 2 diabetes mellitus without complications: Secondary | ICD-10-CM

## 2023-06-24 DIAGNOSIS — M79674 Pain in right toe(s): Secondary | ICD-10-CM | POA: Diagnosis not present

## 2023-06-24 DIAGNOSIS — B351 Tinea unguium: Secondary | ICD-10-CM | POA: Diagnosis not present

## 2023-06-24 DIAGNOSIS — M79675 Pain in left toe(s): Secondary | ICD-10-CM

## 2023-06-24 NOTE — Progress Notes (Signed)
This patient returns to my office for at risk foot care.  This patient requires this care by a professional since this patient will be at risk due to having  Diabetes type 2.  This patient is unable to cut nails herself since the patient cannot reach her nails.These nails are painful walking and wearing shoes.  This patient presents for at risk foot care today.  General Appearance  Alert, conversant and in no acute stress.  Vascular  Dorsalis pedis and posterior tibial  pulses are weakly  palpable  bilaterally.  Capillary return is within normal limits  bilaterally. Temperature is within normal limits  Bilaterally. Absent hair.  Neurologic  Senn-Weinstein monofilament wire test within normal limits  bilaterally. Muscle power within normal limits bilaterally.  Nails Thick disfigured discolored nails with subungual debris  from hallux to fifth toes bilaterally. No evidence of bacterial infection or drainage bilaterally.  Orthopedic  No limitations of motion  feet .  No crepitus or effusions noted.  No bony pathology or digital deformities noted.  HAV  B/L.  Skin  normotropic skin with no porokeratosis noted bilaterally.  No signs of infections or ulcers noted.     Onychomycosis  Pain in right toes  Pain in left toes  Consent was obtained for treatment procedures.   Mechanical debridement of nails 1-5  bilaterally performed with a nail nipper.  Filed with dremel without incident.     Return office visit    3 months for RFC.                 Told patient to return for periodic foot care and evaluation due to potential at risk complications.   Helane Gunther DPM

## 2023-07-16 ENCOUNTER — Encounter: Payer: Self-pay | Admitting: Physician Assistant

## 2023-07-16 ENCOUNTER — Ambulatory Visit (INDEPENDENT_AMBULATORY_CARE_PROVIDER_SITE_OTHER): Payer: Medicare HMO | Admitting: Physician Assistant

## 2023-07-16 DIAGNOSIS — Z87891 Personal history of nicotine dependence: Secondary | ICD-10-CM

## 2023-07-16 NOTE — Patient Instructions (Signed)

## 2023-07-16 NOTE — Progress Notes (Signed)
Virtual Visit via Telephone Note  I connected with Jessica Jacobs on 07/16/23 at 9:30 AM by telephone and verified that I am speaking with the correct person using two identifiers.  Location: Patient: home Provider: working virtually from home   I discussed the limitations, risks, security and privacy concerns of performing an evaluation and management service by telephone and the availability of in person appointments. I also discussed with the patient that there may be a patient responsible charge related to this service. The patient expressed understanding and agreed to proceed.       Shared Decision Making Visit Lung Cancer Screening Program [phone])   Eligibility: Age 77 Pack Years Smoking History Calculation 16 (# packs/per year x # years smoked) Recent History of coughing up blood  no Unexplained weight loss? No ( >Than 15 pounds within the last 6 months ) Prior History Lung / other cancer No (Diagnosis within the last 5 years already requiring surveillance chest CT Scans). Smoking Status Former Smoker Former Smokers: Years since quit: 14 years  Quit Date: 2011  Visit Components: Discussion included one or more decision making aids? Yes Discussion included risk/benefits of screening. Yes Discussion included potential follow up diagnostic testing for abnormal scans. Yes Discussion included meaning and risk of over diagnosis. Yes Discussion included meaning and risk of False Positives. Yes Discussion included meaning of total radiation exposure. Yes  Counseling Included: Importance of adherence to annual lung cancer LDCT screening. Yes Impact of comorbidities on ability to participate in the program. Yes Ability and willingness to under diagnostic treatment. Yes  Smoking Cessation Counseling: Former Smokers:  Discussed the importance of maintaining cigarette abstinence. Yes Diagnosis Code: Personal History of Nicotine Dependence. N56.213 Information about  tobacco cessation classes and interventions provided to patient. Yes Written Order for Lung Cancer Screening with LDCT placed in Epic. Yes (CT Chest Lung Cancer Screening Low Dose W/O CM) [id number] Z12.2-Screening of respiratory organs Z87.891-Personal history of nicotine dependence    I spent 25 minutes of face to face time/virtual visit time  with the patient discussing the risks and benefits of lung cancer screening. We took the time to pause at intervals to allow for questions to be asked and answered to ensure understanding. We discussed that they had taken the single most powerful action possible to decrease their risk of developing lung cancer when they quit smoking. I counseled them to remain smoke free, and to contact the office if they ever had the desire to smoke again so that we can provide resources and tools to help support the effort to remain smoke free. We discussed the time and location of the scan, and they  will receive a call or letter with the results within  24-72 hours of receiving them. They have the office contact information in the event they have questions.   They verbalized understanding of all of the above and had no further questions.    I explained to the patient that there has been a high incidence of coronary artery disease noted on these exams. I explained that this is a non-gated exam therefore degree or severity cannot be determined. This patient is on statin therapy. I have asked the patient to follow-up with their PCP regarding any incidental finding of coronary artery disease and management with diet or medication as they feel is clinically indicated. The patient verbalized understanding of the above and had no further questions.      Darcella Gasman Shiheem Corporan, PA-C

## 2023-07-17 ENCOUNTER — Ambulatory Visit
Admission: RE | Admit: 2023-07-17 | Discharge: 2023-07-17 | Disposition: A | Discharge status: Home / Self Care | Payer: Medicare HMO | Source: Ambulatory Visit | Attending: Internal Medicine | Admitting: Internal Medicine

## 2023-07-17 DIAGNOSIS — Z122 Encounter for screening for malignant neoplasm of respiratory organs: Secondary | ICD-10-CM

## 2023-07-17 DIAGNOSIS — Z87891 Personal history of nicotine dependence: Secondary | ICD-10-CM | POA: Diagnosis not present

## 2023-07-22 ENCOUNTER — Ambulatory Visit (INDEPENDENT_AMBULATORY_CARE_PROVIDER_SITE_OTHER): Payer: Medicare HMO | Admitting: Podiatry

## 2023-07-22 DIAGNOSIS — E119 Type 2 diabetes mellitus without complications: Secondary | ICD-10-CM

## 2023-07-22 DIAGNOSIS — M2010 Hallux valgus (acquired), unspecified foot: Secondary | ICD-10-CM

## 2023-07-22 NOTE — Progress Notes (Signed)
Patient presents today to casted for diabetic shoes and insoles.  Patient was measured for  1 pair of diabetic shoes and 3 pairs of foam casted diabetic insoles.   Ht 5'5 Wt 180 Shoe size  10w Shoe type 981 or [id number]  Treating physician  ronald polite   Financial forms signed    Re-appointment for regularly scheduled diabetic foot care visits or if they should experience any trouble with the shoes or insoles.

## 2023-08-20 ENCOUNTER — Other Ambulatory Visit: Payer: Self-pay | Admitting: Acute Care

## 2023-08-20 DIAGNOSIS — Z87891 Personal history of nicotine dependence: Secondary | ICD-10-CM

## 2023-08-20 DIAGNOSIS — Z122 Encounter for screening for malignant neoplasm of respiratory organs: Secondary | ICD-10-CM

## 2023-08-21 DIAGNOSIS — E119 Type 2 diabetes mellitus without complications: Secondary | ICD-10-CM | POA: Diagnosis not present

## 2023-08-21 DIAGNOSIS — Z03818 Encounter for observation for suspected exposure to other biological agents ruled out: Secondary | ICD-10-CM | POA: Diagnosis not present

## 2023-08-21 DIAGNOSIS — J069 Acute upper respiratory infection, unspecified: Secondary | ICD-10-CM | POA: Diagnosis not present

## 2023-08-21 DIAGNOSIS — U071 COVID-19: Secondary | ICD-10-CM | POA: Diagnosis not present

## 2023-09-20 ENCOUNTER — Ambulatory Visit (INDEPENDENT_AMBULATORY_CARE_PROVIDER_SITE_OTHER): Payer: Medicare HMO

## 2023-09-20 ENCOUNTER — Ambulatory Visit (HOSPITAL_COMMUNITY)
Admission: EM | Admit: 2023-09-20 | Discharge: 2023-09-20 | Disposition: A | Discharge status: Home / Self Care | Payer: Medicare HMO | Attending: Emergency Medicine | Admitting: Emergency Medicine

## 2023-09-20 ENCOUNTER — Encounter (HOSPITAL_COMMUNITY): Payer: Self-pay | Admitting: Emergency Medicine

## 2023-09-20 DIAGNOSIS — I700 Atherosclerosis of aorta: Secondary | ICD-10-CM | POA: Diagnosis not present

## 2023-09-20 DIAGNOSIS — R059 Cough, unspecified: Secondary | ICD-10-CM | POA: Diagnosis not present

## 2023-09-20 DIAGNOSIS — J449 Chronic obstructive pulmonary disease, unspecified: Secondary | ICD-10-CM | POA: Diagnosis not present

## 2023-09-20 DIAGNOSIS — R051 Acute cough: Secondary | ICD-10-CM

## 2023-09-20 HISTORY — DX: Chronic obstructive pulmonary disease, unspecified: J44.9

## 2023-09-20 MED ORDER — DOXYCYCLINE HYCLATE 100 MG PO CAPS: 100.0000 mg | ORAL_CAPSULE | Freq: Two times a day (BID) | ORAL | 0 refills | Status: AC

## 2023-09-20 MED ORDER — IPRATROPIUM-ALBUTEROL 0.5-2.5 (3) MG/3ML IN SOLN
3.0000 mL | Freq: Once | RESPIRATORY_TRACT | Status: AC
  Administered 2023-09-20: 3 mL via RESPIRATORY_TRACT

## 2023-09-20 MED ORDER — IPRATROPIUM-ALBUTEROL 0.5-2.5 (3) MG/3ML IN SOLN
RESPIRATORY_TRACT | Status: AC
  Filled 2023-09-20: qty 3

## 2023-09-20 MED ORDER — PREDNISONE 20 MG PO TABS: 40.0000 mg | ORAL_TABLET | Freq: Every day | ORAL | 0 refills | Status: AC

## 2023-09-20 NOTE — ED Triage Notes (Signed)
Pt had cough for a couple weeks. Reports sometimes productive but swallows it. Hasn't taken medications for cough

## 2023-09-20 NOTE — Discharge Instructions (Addendum)
With duration of cough and your history of COPD, I am treating you with an antibiotic. Please take the doxycyline twice daily for 7 days. Finish the full course!  Starting tomorrow, take the prednisone once daily for 5 days. Please monitor your blood glucose while taking this medicine.  Continue using your Trelegy every morning  Please call your primary care provider for follow up.  I will call you if the results of your chest xray require different treatment. Please go to the emergency department if symptoms worsen.

## 2023-09-20 NOTE — ED Provider Notes (Signed)
[facility]    CSN: [id number] Arrival date & time: 09/20/23  1608     History   Chief Complaint Chief Complaint  Patient presents with   Cough    HPI Jessica Jacobs is a 77 y.o. female.  1.5 week history of cough, sometimes productive Short of breath when she has coughing fits but not at rest No chest pain or tightness No fever or chills Denies swelling of extremities  Has COPD. She uses Trelegy daily.  Former smoker. Follows with pulmonology  Had chest CT 2 months ago - annual screening  Past Medical History:  Diagnosis Date   Anemia    BACK PAIN, LUMBAR 12/07/2008   Qualifier: Diagnosis of  By: Barbaraann Barthel MD, [place]     Cellulitis and abscess of other specified site 01/26/2008   Qualifier: Diagnosis of  By: Barbaraann Barthel MD, Wilnette Kales, PARTIAL, WITH ANASTOMOSIS, HX OF 08/26/2007   Qualifier: Diagnosis of  By: Barbaraann Barthel MD, [place]     COPD (chronic obstructive pulmonary disease) (HCC)    Diabetes mellitus without complication (HCC)    DIABETES MELLITUS, TYPE II 08/26/2007   Qualifier: Diagnosis of  By: Barbaraann Barthel MD, Lenox Ahr, HX OF 08/26/2007   Qualifier: Diagnosis of  By: Barbaraann Barthel MD, Vernie Murders POSITIVE STOOL 10/06/2010   Annotation: Dr. Corinda Gubler rec 10.2011:  repeat hemoccults; if + and anemic get  EGD Qualifier: Diagnosis of  By: Huntley Dec, [place]     HEADACHE 11/05/2007   Qualifier: Diagnosis of  By: Barbaraann Barthel MD, Alicia Amel 08/26/2007   Qualifier: Diagnosis of  By: Barbaraann Barthel MD, [place]     Hypertension    OBESITY 02/22/2011   Qualifier: Diagnosis of  By: Daphine Deutscher FNP, Zena Amos     ONYCHOMYCOSIS 10/26/2009   Qualifier: Diagnosis of  By: Huntley Dec, Scott     PHARYNGITIS 12/21/2010   Qualifier: Diagnosis of  By: Daphine Deutscher FNP, [place]     SEBACEOUS CYST, INFECTED 01/26/2008   Qualifier: Diagnosis of  By: Barbaraann Barthel MD, [place]     SKIN TAG 12/19/2009   Qualifier: Diagnosis of  By: Huntley Dec, [place]      URI 08/10/2008   Qualifier: Diagnosis of  By: Barbaraann Barthel MD, Loraine Grip, CANDIDAL 02/28/2010   Qualifier: Diagnosis of  By: Huntley Dec, Scott     WEIGHT GAIN 06/15/2008   Qualifier: Diagnosis of  By: Barbaraann Barthel MD, [place]      Patient Active Problem List   Diagnosis Date Noted   Abnormal mammogram 06/14/2021   Decreased estrogen level 06/14/2021   Diabetic neuropathy (HCC) 06/14/2021   Gastrointestinal hemorrhage 06/14/2021   Hardening of the aorta (main artery of the heart) (HCC) 06/14/2021   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   Melena 06/14/2021   Overweight 06/14/2021   Pure hypercholesterolemia 06/14/2021   Solitary pulmonary nodule 06/14/2021   Hav (hallux abducto valgus), unspecified laterality 03/08/2021   Pain due to onychomycosis of toenails of both feet 08/28/2019   Diabetes mellitus without complication (HCC) 08/28/2019   OBESITY 02/22/2011   PHARYNGITIS 12/21/2010   GUAIAC POSITIVE STOOL 10/06/2010   VAGINITIS, CANDIDAL 02/28/2010   SKIN TAG 12/19/2009   ONYCHOMYCOSIS 10/26/2009   BACK PAIN, LUMBAR 12/07/2008   URI 08/10/2008   WEIGHT GAIN 06/15/2008   CELLULITIS AND ABSCESS OF OTHER SPECIFIED SITE 01/26/2008   SEBACEOUS CYST, INFECTED 01/26/2008   HEADACHE 11/05/2007   DIABETES MELLITUS,  TYPE II 08/26/2007   HYPERLIPIDEMIA 08/26/2007   HYPERTENSION 08/26/2007   DIVERTICULITIS, HX OF 08/26/2007   COLECTOMY, PARTIAL, WITH ANASTOMOSIS, HX OF 08/26/2007    Past Surgical History:  Procedure Laterality Date   ABDOMINAL HYSTERECTOMY     BACK SURGERY     BREAST LUMPECTOMY WITH RADIOACTIVE SEED LOCALIZATION Left 08/26/2018   Procedure: LEFT BREAST LUMPECTOMY WITH RADIOACTIVE SEED LOCALIZATION;  Surgeon: Harriette Bouillon, MD;  Location: [facility];  Service: General;  Laterality: Left;   EYE SURGERY      OB History   No obstetric history on file.      Home Medications    Prior to Admission medications    Medication Sig Start Date End Date Taking? Authorizing Provider  doxycycline (VIBRAMYCIN) 100 MG capsule Take 1 capsule (100 mg total) by mouth 2 (two) times daily for 7 days. 09/20/23 09/27/23 Yes Vlasta Baskin, Lurena Joiner, PA-C  predniSONE (DELTASONE) 20 MG tablet Take 2 tablets (40 mg total) by mouth daily with breakfast for 5 days. 09/20/23 09/25/23 Yes Stryder Poitra, Lurena Joiner, PA-C  ACCU-CHEK AVIVA PLUS test strip  11/03/20   [provider]  Accu-Chek Softclix Lancets lancets  11/23/19   [provider]  albuterol (VENTOLIN HFA) 108 (90 Base) MCG/ACT inhaler Inhale 2 puffs into the lungs every 6 (six) hours as needed for wheezing or shortness of breath. 04/17/22   Martina Sinner, MD  Alcohol Swabs (B-D SINGLE USE SWABS REGULAR) PADS  11/03/20   [provider]  Cholecalciferol (VITAMIN D-3 PO) Take by mouth.    [provider]  DROPLET PEN NEEDLES 31G X 8 MM MISC  12/22/19   [provider]  gabapentin (NEURONTIN) 300 MG capsule Take 300 mg by mouth 3 (three) times daily. Take 300mg  every morning and 600mg  every night    [provider]  glimepiride (AMARYL) 4 MG tablet Take 4 mg by mouth 2 (two) times daily.    [provider]  hydrochlorothiazide (HYDRODIURIL) 25 MG tablet Take 25 mg by mouth daily.    [provider]  IRON PO Take by mouth.    [provider]  LANTUS SOLOSTAR 100 UNIT/ML Solostar Pen  12/17/19   [provider]  latanoprost (XALATAN) 0.005 % ophthalmic solution SMARTSIG:In Eye(s) 07/17/22   [provider]  lisinopril (PRINIVIL,ZESTRIL) 40 MG tablet TAKE ONE TABLET BY MOUTH EVERY DAY IN THE MORNING 08/16/11   Tereso Newcomer T, PA-C  lovastatin (MEVACOR) 40 MG tablet Take 40 mg by mouth at bedtime.    [provider]  metFORMIN (GLUCOPHAGE-XR) 500 MG 24 hr tablet 2 tablets 06/13/21   [provider]  metoprolol succinate (TOPROL-XL) 50 MG 24 hr tablet Take 50 mg by mouth daily.  Take with or immediately following a meal.    [provider]  sitaGLIPtin (JANUVIA) 100 MG tablet Take 100 mg by mouth daily.    [provider]  Travoprost, BAK Free, (TRAVATAN) 0.004 % SOLN ophthalmic solution  12/17/19   [provider]  TRELEGY ELLIPTA 100-62.5-25 MCG/ACT AEPB Inhale 1 puff into the lungs daily. 06/13/22   [provider]  vitamin B-12 (CYANOCOBALAMIN) 1000 MCG tablet Take 1,000 mcg by mouth daily.    [provider]    Family History Family History  Problem Relation Age of Onset   Cirrhosis Father    Breast cancer Neg Hx     Social History Social History   Tobacco Use   Smoking status: Former  Current packs/day: 0.00    Average packs/day: 1.5 packs/day for 40.0 years (60.0 ttl pk-yrs)    Types: Cigarettes    Start date: 12/31/1972    Quit date: 12/31/2012    Years since quitting: 10.7   Smokeless tobacco: Never  Vaping Use   Vaping status: Never Used  Substance Use Topics   Alcohol use: No   Drug use: Yes    Types: Marijuana     Allergies   Patient has no known allergies.   Review of Systems Review of Systems  Respiratory:  Positive for cough.    As per HPI  Physical Exam Triage Vital Signs ED Triage Vitals  Encounter Vitals Group     BP 09/20/23 1623 (!) 147/86     Systolic BP Percentile --      Diastolic BP Percentile --      Pulse Rate 09/20/23 1623 83     Resp 09/20/23 1623 (!) 26     Temp 09/20/23 1623 98.4 F (36.9 C)     Temp Source 09/20/23 1623 Oral     SpO2 09/20/23 1623 93 %     Weight --      Height --      Head Circumference --      Peak Flow --      Pain Score 09/20/23 1622 0     Pain Loc --      Pain Education --      Exclude from Growth Chart --    No data found.  Updated Vital Signs BP (!) 147/86 (BP Location: Right Arm)   Pulse 83   Temp 98.4 F (36.9 C) (Oral)   Resp 20   SpO2 93%     Physical Exam Vitals and nursing note reviewed.  Constitutional:       General: She is not in acute distress.    Appearance: She is not ill-appearing.     Comments: Speaks in full sentences. No acute respiratory distress  HENT:     Nose: No congestion or rhinorrhea.     Mouth/Throat:     Mouth: Mucous membranes are moist.     Pharynx: Oropharynx is clear.  Cardiovascular:     Rate and Rhythm: Normal rate and regular rhythm.     Pulses: Normal pulses.     Heart sounds: Normal heart sounds.  Pulmonary:     Effort: No respiratory distress.     Breath sounds: Wheezing present.     Comments: Wheezing throughout. Some crackles in the lower lobes. Wheezing cough with sputum production during exam. Normal work of breathing after cough subsides  Musculoskeletal:     Cervical back: Normal range of motion.  Lymphadenopathy:     Cervical: No cervical adenopathy.  Skin:    General: Skin is warm and dry.  Neurological:     Mental Status: She is alert and oriented to person, place, and time.     UC Treatments / Results  Labs (all labs ordered are listed, but only abnormal results are displayed) Labs Reviewed - No data to display  EKG  Radiology DG Chest 2 View  Result Date: 09/20/2023 CLINICAL DATA:  Cough EXAM: CHEST - 2 VIEW COMPARISON:  07/17/2023, 01/16/2019 FINDINGS: The heart size and mediastinal contours are stable. Aortic atherosclerosis. No focal airspace consolidation, pleural effusion, or pneumothorax. The visualized skeletal structures are unremarkable. IMPRESSION: No active cardiopulmonary disease. Electronically Signed   By: Duanne Guess D.O.   On: 09/20/2023 18:36    Procedures  Procedures  Medications Ordered in UC Medications  ipratropium-albuterol (DUONEB) 0.5-2.5 (3) MG/3ML nebulizer solution 3 mL (3 mLs Nebulization Given 09/20/23 1654)    Initial Impression / Assessment and Plan / UC Course  I have reviewed the triage vital signs and the nursing notes.  Pertinent labs & imaging results that were available during my care of the  patient were reviewed by me and considered in my medical decision making (see chart for details).  Duoneb given with some improvement  Chest xray is negative  With duration of symptoms and COPD history, cover for bacterial etiology with doxycycline BID x 7 days, prednisone 40 mg daily x 5 No concern for COPD exacerbation at this time although underlying diease likely contributing. Discussed strict return and ED precautions. Advised close follow up with primary care. Patient agreeable to plan   Final Clinical Impressions(s) / UC Diagnoses   Final diagnoses:  Acute cough  Chronic obstructive pulmonary disease, unspecified COPD type [facility])     Discharge Instructions      With duration of cough and your history of COPD, I am treating you with an antibiotic. Please take the doxycyline twice daily for 7 days. Finish the full course!  Starting tomorrow, take the prednisone once daily for 5 days. Please monitor your blood glucose while taking this medicine.  Continue using your Trelegy every morning  Please call your primary care provider for follow up.  I will call you if the results of your chest xray require different treatment. Please go to the emergency department if symptoms worsen.     ED Prescriptions     Medication Sig Dispense Auth. Provider   doxycycline (VIBRAMYCIN) 100 MG capsule Take 1 capsule (100 mg total) by mouth 2 (two) times daily for 7 days. 14 capsule Anjel Pardo, PA-C   predniSONE (DELTASONE) 20 MG tablet Take 2 tablets (40 mg total) by mouth daily with breakfast for 5 days. 10 tablet Nahara Dona, Lurena Joiner, PA-C      PDMP not reviewed this encounter.   Kathrine Haddock 09/20/23 1840

## 2023-09-26 ENCOUNTER — Ambulatory Visit (INDEPENDENT_AMBULATORY_CARE_PROVIDER_SITE_OTHER): Payer: Medicare HMO | Admitting: Podiatry

## 2023-09-26 ENCOUNTER — Encounter: Payer: Self-pay | Admitting: Podiatry

## 2023-09-26 DIAGNOSIS — M79674 Pain in right toe(s): Secondary | ICD-10-CM | POA: Diagnosis not present

## 2023-09-26 DIAGNOSIS — M79675 Pain in left toe(s): Secondary | ICD-10-CM

## 2023-09-26 DIAGNOSIS — B351 Tinea unguium: Secondary | ICD-10-CM | POA: Diagnosis not present

## 2023-09-26 DIAGNOSIS — E119 Type 2 diabetes mellitus without complications: Secondary | ICD-10-CM

## 2023-09-26 NOTE — Progress Notes (Signed)
This patient returns to my office for at risk foot care.  This patient requires this care by a professional since this patient will be at risk due to having  Diabetes type 2.  This patient is unable to cut nails herself since the patient cannot reach her nails.These nails are painful walking and wearing shoes.  This patient presents for at risk foot care today.  General Appearance  Alert, conversant and in no acute stress.  Vascular  Dorsalis pedis and posterior tibial  pulses are weakly  palpable  bilaterally.  Capillary return is within normal limits  bilaterally. Temperature is within normal limits  Bilaterally. Absent hair.  Neurologic  Senn-Weinstein monofilament wire test within normal limits  bilaterally. Muscle power within normal limits bilaterally.  Nails Thick disfigured discolored nails with subungual debris  from hallux to fifth toes bilaterally. No evidence of bacterial infection or drainage bilaterally.  Orthopedic  No limitations of motion  feet .  No crepitus or effusions noted.  No bony pathology or digital deformities noted.  HAV  B/L.  Skin  normotropic skin with no porokeratosis noted bilaterally.  No signs of infections or ulcers noted.     Onychomycosis  Pain in right toes  Pain in left toes  Consent was obtained for treatment procedures.   Mechanical debridement of nails 1-5  bilaterally performed with a nail nipper.  Filed with dremel without incident.     Return office visit    3 months for RFC.                 Told patient to return for periodic foot care and evaluation due to potential at risk complications.   Helane Gunther DPM

## 2023-10-02 DIAGNOSIS — E119 Type 2 diabetes mellitus without complications: Secondary | ICD-10-CM | POA: Diagnosis not present

## 2023-10-02 DIAGNOSIS — E1122 Type 2 diabetes mellitus with diabetic chronic kidney disease: Secondary | ICD-10-CM | POA: Diagnosis not present

## 2023-10-02 DIAGNOSIS — J449 Chronic obstructive pulmonary disease, unspecified: Secondary | ICD-10-CM | POA: Diagnosis not present

## 2023-10-02 DIAGNOSIS — Z0000 Encounter for general adult medical examination without abnormal findings: Secondary | ICD-10-CM | POA: Diagnosis not present

## 2023-10-02 DIAGNOSIS — I700 Atherosclerosis of aorta: Secondary | ICD-10-CM | POA: Diagnosis not present

## 2023-10-02 DIAGNOSIS — Z1389 Encounter for screening for other disorder: Secondary | ICD-10-CM | POA: Diagnosis not present

## 2023-10-02 DIAGNOSIS — Z794 Long term (current) use of insulin: Secondary | ICD-10-CM | POA: Diagnosis not present

## 2023-10-02 DIAGNOSIS — E328 Other diseases of thymus: Secondary | ICD-10-CM | POA: Diagnosis not present

## 2023-10-02 DIAGNOSIS — Z23 Encounter for immunization: Secondary | ICD-10-CM | POA: Diagnosis not present

## 2023-10-02 DIAGNOSIS — E1165 Type 2 diabetes mellitus with hyperglycemia: Secondary | ICD-10-CM | POA: Diagnosis not present

## 2023-10-02 DIAGNOSIS — N1831 Chronic kidney disease, stage 3a: Secondary | ICD-10-CM | POA: Diagnosis not present

## 2023-10-17 ENCOUNTER — Other Ambulatory Visit: Payer: Self-pay | Admitting: Internal Medicine

## 2023-10-17 DIAGNOSIS — E1169 Type 2 diabetes mellitus with other specified complication: Secondary | ICD-10-CM | POA: Diagnosis not present

## 2023-10-17 DIAGNOSIS — E119 Type 2 diabetes mellitus without complications: Secondary | ICD-10-CM | POA: Diagnosis not present

## 2023-10-17 DIAGNOSIS — E2839 Other primary ovarian failure: Secondary | ICD-10-CM

## 2023-12-26 ENCOUNTER — Ambulatory Visit: Payer: Medicare HMO | Admitting: Podiatry

## 2023-12-26 ENCOUNTER — Encounter: Payer: Self-pay | Admitting: Podiatry

## 2023-12-26 DIAGNOSIS — M79675 Pain in left toe(s): Secondary | ICD-10-CM | POA: Diagnosis not present

## 2023-12-26 DIAGNOSIS — M2010 Hallux valgus (acquired), unspecified foot: Secondary | ICD-10-CM

## 2023-12-26 DIAGNOSIS — M79674 Pain in right toe(s): Secondary | ICD-10-CM

## 2023-12-26 DIAGNOSIS — B351 Tinea unguium: Secondary | ICD-10-CM

## 2023-12-26 DIAGNOSIS — E119 Type 2 diabetes mellitus without complications: Secondary | ICD-10-CM | POA: Diagnosis not present

## 2023-12-26 NOTE — Progress Notes (Signed)
This patient returns to my office for at risk foot care.  This patient requires this care by a professional since this patient will be at risk due to having  Diabetes type 2.  This patient is unable to cut nails herself since the patient cannot reach her nails.These nails are painful walking and wearing shoes.  This patient presents for at risk foot care today.  General Appearance  Alert, conversant and in no acute stress.  Vascular  Dorsalis pedis and posterior tibial  pulses are weakly  palpable  bilaterally.  Capillary return is within normal limits  bilaterally. Temperature is within normal limits  Bilaterally. Absent hair.  Neurologic  Senn-Weinstein monofilament wire test within normal limits  bilaterally. Muscle power within normal limits bilaterally.  Nails Thick disfigured discolored nails with subungual debris  from hallux to fifth toes bilaterally. No evidence of bacterial infection or drainage bilaterally.  Orthopedic  No limitations of motion  feet .  No crepitus or effusions noted.  No bony pathology or digital deformities noted.  HAV  B/L.  Skin  normotropic skin with no porokeratosis noted bilaterally.  No signs of infections or ulcers noted.     Onychomycosis  Pain in right toes  Pain in left toes  Consent was obtained for treatment procedures.   Mechanical debridement of nails 1-5  bilaterally performed with a nail nipper.  Filed with dremel without incident.  Talked to Trish about her diabetic shoes.    Return office visit    3 months for RFC.                 Told patient to return for periodic foot care and evaluation due to potential at risk complications.   Helane Gunther DPM

## 2024-01-09 ENCOUNTER — Telehealth: Payer: Self-pay

## 2024-01-09 NOTE — Telephone Encounter (Signed)
 Shoes are in [place] on inserts they were released 1/6 from back order

## 2024-03-16 DIAGNOSIS — Z794 Long term (current) use of insulin: Secondary | ICD-10-CM | POA: Diagnosis not present

## 2024-03-16 DIAGNOSIS — E669 Obesity, unspecified: Secondary | ICD-10-CM | POA: Diagnosis not present

## 2024-03-16 DIAGNOSIS — I700 Atherosclerosis of aorta: Secondary | ICD-10-CM | POA: Diagnosis not present

## 2024-03-16 DIAGNOSIS — M2010 Hallux valgus (acquired), unspecified foot: Secondary | ICD-10-CM | POA: Diagnosis not present

## 2024-03-16 DIAGNOSIS — E1142 Type 2 diabetes mellitus with diabetic polyneuropathy: Secondary | ICD-10-CM | POA: Diagnosis not present

## 2024-03-16 DIAGNOSIS — E1151 Type 2 diabetes mellitus with diabetic peripheral angiopathy without gangrene: Secondary | ICD-10-CM | POA: Diagnosis not present

## 2024-03-16 DIAGNOSIS — N1831 Chronic kidney disease, stage 3a: Secondary | ICD-10-CM | POA: Diagnosis not present

## 2024-03-16 DIAGNOSIS — D509 Iron deficiency anemia, unspecified: Secondary | ICD-10-CM | POA: Diagnosis not present

## 2024-03-16 DIAGNOSIS — Z6831 Body mass index (BMI) 31.0-31.9, adult: Secondary | ICD-10-CM | POA: Diagnosis not present

## 2024-03-16 DIAGNOSIS — E559 Vitamin D deficiency, unspecified: Secondary | ICD-10-CM | POA: Diagnosis not present

## 2024-03-16 DIAGNOSIS — E11630 Type 2 diabetes mellitus with periodontal disease: Secondary | ICD-10-CM | POA: Diagnosis not present

## 2024-03-16 DIAGNOSIS — E785 Hyperlipidemia, unspecified: Secondary | ICD-10-CM | POA: Diagnosis not present

## 2024-03-16 DIAGNOSIS — J439 Emphysema, unspecified: Secondary | ICD-10-CM | POA: Diagnosis not present

## 2024-03-16 DIAGNOSIS — I129 Hypertensive chronic kidney disease with stage 1 through stage 4 chronic kidney disease, or unspecified chronic kidney disease: Secondary | ICD-10-CM | POA: Diagnosis not present

## 2024-03-16 DIAGNOSIS — Z7951 Long term (current) use of inhaled steroids: Secondary | ICD-10-CM | POA: Diagnosis not present

## 2024-03-16 DIAGNOSIS — E1122 Type 2 diabetes mellitus with diabetic chronic kidney disease: Secondary | ICD-10-CM | POA: Diagnosis not present

## 2024-03-16 DIAGNOSIS — Z7984 Long term (current) use of oral hypoglycemic drugs: Secondary | ICD-10-CM | POA: Diagnosis not present

## 2024-03-27 ENCOUNTER — Encounter: Payer: Self-pay | Admitting: Podiatry

## 2024-03-27 ENCOUNTER — Ambulatory Visit: Payer: Medicare HMO | Admitting: Podiatry

## 2024-03-27 DIAGNOSIS — M79674 Pain in right toe(s): Secondary | ICD-10-CM | POA: Diagnosis not present

## 2024-03-27 DIAGNOSIS — E119 Type 2 diabetes mellitus without complications: Secondary | ICD-10-CM

## 2024-03-27 DIAGNOSIS — B351 Tinea unguium: Secondary | ICD-10-CM

## 2024-03-27 DIAGNOSIS — M79675 Pain in left toe(s): Secondary | ICD-10-CM

## 2024-03-27 NOTE — Progress Notes (Signed)
 This patient returns to my office for at risk foot care.  This patient requires this care by a professional since this patient will be at risk due to having  Diabetes type 2.  This patient is unable to cut nails herself since the patient cannot reach her nails.These nails are painful walking and wearing shoes.  This patient presents for at risk foot care today.  General Appearance  Alert, conversant and in no acute stress.  Vascular  Dorsalis pedis and posterior tibial  pulses are weakly  palpable  bilaterally.  Capillary return is within normal limits  bilaterally. Temperature is within normal limits  Bilaterally. Absent hair.  Neurologic  Senn-Weinstein monofilament wire test within normal limits  bilaterally. Muscle power within normal limits bilaterally.  Nails Thick disfigured discolored nails with subungual debris  from hallux to fifth toes bilaterally. No evidence of bacterial infection or drainage bilaterally.  Orthopedic  No limitations of motion  feet .  No crepitus or effusions noted.  No bony pathology or digital deformities noted.  HAV  B/L.  Skin  normotropic skin with no porokeratosis noted bilaterally.  No signs of infections or ulcers noted.     Onychomycosis  Pain in right toes  Pain in left toes  Consent was obtained for treatment procedures.   Mechanical debridement of nails 1-5  bilaterally performed with a nail nipper.  Filed with dremel without incident.  Talked to Trish about her diabetic shoes.    Return office visit    3 months for RFC.                 Told patient to return for periodic foot care and evaluation due to potential at risk complications.   Helane Gunther DPM

## 2024-04-17 ENCOUNTER — Other Ambulatory Visit: Payer: Self-pay | Admitting: Internal Medicine

## 2024-04-17 DIAGNOSIS — Z1231 Encounter for screening mammogram for malignant neoplasm of breast: Secondary | ICD-10-CM

## 2024-05-19 DIAGNOSIS — B078 Other viral warts: Secondary | ICD-10-CM | POA: Diagnosis not present

## 2024-05-19 DIAGNOSIS — I700 Atherosclerosis of aorta: Secondary | ICD-10-CM | POA: Diagnosis not present

## 2024-05-19 DIAGNOSIS — E1165 Type 2 diabetes mellitus with hyperglycemia: Secondary | ICD-10-CM | POA: Diagnosis not present

## 2024-05-19 DIAGNOSIS — N1831 Chronic kidney disease, stage 3a: Secondary | ICD-10-CM | POA: Diagnosis not present

## 2024-05-19 DIAGNOSIS — L538 Other specified erythematous conditions: Secondary | ICD-10-CM | POA: Diagnosis not present

## 2024-05-19 DIAGNOSIS — L858 Other specified epidermal thickening: Secondary | ICD-10-CM | POA: Diagnosis not present

## 2024-05-19 DIAGNOSIS — Z794 Long term (current) use of insulin: Secondary | ICD-10-CM | POA: Diagnosis not present

## 2024-05-19 DIAGNOSIS — R238 Other skin changes: Secondary | ICD-10-CM | POA: Diagnosis not present

## 2024-05-19 DIAGNOSIS — Z789 Other specified health status: Secondary | ICD-10-CM | POA: Diagnosis not present

## 2024-05-19 DIAGNOSIS — I10 Essential (primary) hypertension: Secondary | ICD-10-CM | POA: Diagnosis not present

## 2024-05-19 DIAGNOSIS — E7800 Pure hypercholesterolemia, unspecified: Secondary | ICD-10-CM | POA: Diagnosis not present

## 2024-05-19 DIAGNOSIS — E328 Other diseases of thymus: Secondary | ICD-10-CM | POA: Diagnosis not present

## 2024-05-19 DIAGNOSIS — J449 Chronic obstructive pulmonary disease, unspecified: Secondary | ICD-10-CM | POA: Diagnosis not present

## 2024-05-19 DIAGNOSIS — E8770 Fluid overload, unspecified: Secondary | ICD-10-CM | POA: Diagnosis not present

## 2024-05-19 DIAGNOSIS — E1122 Type 2 diabetes mellitus with diabetic chronic kidney disease: Secondary | ICD-10-CM | POA: Diagnosis not present

## 2024-05-20 ENCOUNTER — Ambulatory Visit
Admission: RE | Admit: 2024-05-20 | Discharge: 2024-05-20 | Disposition: A | Discharge status: Home / Self Care | Source: Ambulatory Visit | Attending: Internal Medicine | Admitting: Internal Medicine

## 2024-05-20 DIAGNOSIS — Z1231 Encounter for screening mammogram for malignant neoplasm of breast: Secondary | ICD-10-CM

## 2024-06-19 DIAGNOSIS — E8770 Fluid overload, unspecified: Secondary | ICD-10-CM | POA: Diagnosis not present

## 2024-06-19 DIAGNOSIS — I361 Nonrheumatic tricuspid (valve) insufficiency: Secondary | ICD-10-CM | POA: Diagnosis not present

## 2024-06-19 DIAGNOSIS — R600 Localized edema: Secondary | ICD-10-CM | POA: Diagnosis not present

## 2024-07-01 ENCOUNTER — Ambulatory Visit: Admitting: Podiatry

## 2024-07-02 ENCOUNTER — Encounter: Payer: Self-pay | Admitting: Podiatry

## 2024-07-02 ENCOUNTER — Ambulatory Visit: Admitting: Podiatry

## 2024-07-02 DIAGNOSIS — E119 Type 2 diabetes mellitus without complications: Secondary | ICD-10-CM

## 2024-07-02 DIAGNOSIS — M79675 Pain in left toe(s): Secondary | ICD-10-CM

## 2024-07-02 DIAGNOSIS — B351 Tinea unguium: Secondary | ICD-10-CM

## 2024-07-02 DIAGNOSIS — M79674 Pain in right toe(s): Secondary | ICD-10-CM

## 2024-07-02 NOTE — Progress Notes (Signed)
 This patient returns to my office for at risk foot care.  This patient requires this care by a professional since this patient will be at risk due to having  Diabetes type 2.  This patient is unable to cut nails herself since the patient cannot reach her nails.These nails are painful walking and wearing shoes.  This patient presents for at risk foot care today.  General Appearance  Alert, conversant and in no acute stress.  Vascular  Dorsalis pedis and posterior tibial  pulses are weakly  palpable  bilaterally.  Capillary return is within normal limits  bilaterally. Temperature is within normal limits  Bilaterally. Absent hair.  Neurologic  Senn-Weinstein monofilament wire test within normal limits  bilaterally. Muscle power within normal limits bilaterally.  Nails Thick disfigured discolored nails with subungual debris  from hallux to fifth toes bilaterally. No evidence of bacterial infection or drainage bilaterally.  Orthopedic  No limitations of motion  feet .  No crepitus or effusions noted.  No bony pathology or digital deformities noted.  HAV  B/L.  Skin  normotropic skin with no porokeratosis noted bilaterally.  No signs of infections or ulcers noted.     Onychomycosis  Pain in right toes  Pain in left toes  Consent was obtained for treatment procedures.   Mechanical debridement of nails 1-5  bilaterally performed with a nail nipper.  Filed with dremel without incident.  Talked to Trish about her diabetic shoes.    Return office visit    3 months for RFC.                 Told patient to return for periodic foot care and evaluation due to potential at risk complications.   Helane Gunther DPM

## 2024-07-23 ENCOUNTER — Encounter: Payer: Self-pay | Admitting: Acute Care

## 2024-07-28 ENCOUNTER — Ambulatory Visit
Admission: RE | Admit: 2024-07-28 | Discharge: 2024-07-28 | Disposition: A | Discharge status: Home / Self Care | Source: Ambulatory Visit | Attending: Internal Medicine | Admitting: Internal Medicine

## 2024-07-28 DIAGNOSIS — Z87891 Personal history of nicotine dependence: Secondary | ICD-10-CM | POA: Diagnosis not present

## 2024-07-28 DIAGNOSIS — Z122 Encounter for screening for malignant neoplasm of respiratory organs: Secondary | ICD-10-CM

## 2024-08-04 ENCOUNTER — Other Ambulatory Visit: Payer: Self-pay

## 2024-08-04 DIAGNOSIS — Z122 Encounter for screening for malignant neoplasm of respiratory organs: Secondary | ICD-10-CM

## 2024-08-04 DIAGNOSIS — Z87891 Personal history of nicotine dependence: Secondary | ICD-10-CM

## 2024-08-21 ENCOUNTER — Other Ambulatory Visit: Payer: Medicare HMO

## 2024-09-11 DIAGNOSIS — Z03818 Encounter for observation for suspected exposure to other biological agents ruled out: Secondary | ICD-10-CM | POA: Diagnosis not present

## 2024-09-11 DIAGNOSIS — R634 Abnormal weight loss: Secondary | ICD-10-CM | POA: Diagnosis not present

## 2024-09-11 DIAGNOSIS — R059 Cough, unspecified: Secondary | ICD-10-CM | POA: Diagnosis not present

## 2024-09-11 DIAGNOSIS — R55 Syncope and collapse: Secondary | ICD-10-CM | POA: Diagnosis not present

## 2024-09-11 DIAGNOSIS — I959 Hypotension, unspecified: Secondary | ICD-10-CM | POA: Diagnosis not present

## 2024-09-16 DIAGNOSIS — R55 Syncope and collapse: Secondary | ICD-10-CM | POA: Diagnosis not present

## 2024-09-16 DIAGNOSIS — I959 Hypotension, unspecified: Secondary | ICD-10-CM | POA: Diagnosis not present

## 2024-10-01 ENCOUNTER — Encounter: Payer: Self-pay | Admitting: Podiatry

## 2024-10-01 ENCOUNTER — Ambulatory Visit: Admitting: Podiatry

## 2024-10-01 DIAGNOSIS — E119 Type 2 diabetes mellitus without complications: Secondary | ICD-10-CM

## 2024-10-01 DIAGNOSIS — B351 Tinea unguium: Secondary | ICD-10-CM

## 2024-10-01 DIAGNOSIS — M79674 Pain in right toe(s): Secondary | ICD-10-CM

## 2024-10-01 DIAGNOSIS — M79675 Pain in left toe(s): Secondary | ICD-10-CM

## 2024-10-01 NOTE — Progress Notes (Signed)
 This patient returns to my office for at risk foot care.  This patient requires this care by a professional since this patient will be at risk due to having  Diabetes type 2.  This patient is unable to cut nails herself since the patient cannot reach her nails.These nails are painful walking and wearing shoes.  This patient presents for at risk foot care today.  General Appearance  Alert, conversant and in no acute stress.  Vascular  Dorsalis pedis and posterior tibial  pulses are weakly  palpable  bilaterally.  Capillary return is within normal limits  bilaterally. Temperature is within normal limits  Bilaterally. Absent hair.  Neurologic  Senn-Weinstein monofilament wire test within normal limits  bilaterally. Muscle power within normal limits bilaterally.  Nails Thick disfigured discolored nails with subungual debris  from hallux to fifth toes bilaterally. No evidence of bacterial infection or drainage bilaterally.  Orthopedic  No limitations of motion  feet .  No crepitus or effusions noted.  No bony pathology or digital deformities noted.  HAV  B/L.  Skin  normotropic skin with no porokeratosis noted bilaterally.  No signs of infections or ulcers noted.     Onychomycosis  Pain in right toes  Pain in left toes  Consent was obtained for treatment procedures.   Mechanical debridement of nails 1-5  bilaterally performed with a nail nipper.  Filed with dremel without incident.  Talked to Trish about her diabetic shoes.    Return office visit    3 months for RFC.                 Told patient to return for periodic foot care and evaluation due to potential at risk complications.   Helane Gunther DPM

## 2024-10-28 ENCOUNTER — Other Ambulatory Visit (HOSPITAL_COMMUNITY): Payer: Self-pay | Admitting: Internal Medicine

## 2024-10-28 DIAGNOSIS — Z9189 Other specified personal risk factors, not elsewhere classified: Secondary | ICD-10-CM | POA: Diagnosis not present

## 2024-10-28 DIAGNOSIS — R519 Headache, unspecified: Secondary | ICD-10-CM | POA: Diagnosis not present

## 2024-11-06 ENCOUNTER — Ambulatory Visit (HOSPITAL_COMMUNITY)
Admission: RE | Admit: 2024-11-06 | Discharge: 2024-11-06 | Disposition: A | Discharge status: Home / Self Care | Source: Ambulatory Visit | Attending: Internal Medicine | Admitting: Internal Medicine

## 2024-11-06 DIAGNOSIS — I6782 Cerebral ischemia: Secondary | ICD-10-CM | POA: Diagnosis not present

## 2024-11-06 DIAGNOSIS — R519 Headache, unspecified: Secondary | ICD-10-CM | POA: Diagnosis not present

## 2024-11-06 DIAGNOSIS — I672 Cerebral atherosclerosis: Secondary | ICD-10-CM | POA: Diagnosis not present

## 2025-01-01 ENCOUNTER — Encounter: Payer: Self-pay | Admitting: Podiatry

## 2025-01-01 ENCOUNTER — Ambulatory Visit (INDEPENDENT_AMBULATORY_CARE_PROVIDER_SITE_OTHER): Admitting: Podiatry

## 2025-01-01 DIAGNOSIS — M79674 Pain in right toe(s): Secondary | ICD-10-CM | POA: Diagnosis not present

## 2025-01-01 DIAGNOSIS — B351 Tinea unguium: Secondary | ICD-10-CM

## 2025-01-01 DIAGNOSIS — E119 Type 2 diabetes mellitus without complications: Secondary | ICD-10-CM | POA: Diagnosis not present

## 2025-01-01 DIAGNOSIS — M79675 Pain in left toe(s): Secondary | ICD-10-CM | POA: Diagnosis not present

## 2025-01-01 NOTE — Progress Notes (Signed)
This patient returns to my office for at risk foot care.  This patient requires this care by a professional since this patient will be at risk due to having  Diabetes type 2.  This patient is unable to cut nails herself since the patient cannot reach her nails.These nails are painful walking and wearing shoes.  This patient presents for at risk foot care today.  General Appearance  Alert, conversant and in no acute stress.  Vascular  Dorsalis pedis and posterior tibial  pulses are weakly  palpable  bilaterally.  Capillary return is within normal limits  bilaterally. Temperature is within normal limits  Bilaterally. Absent hair.  Neurologic  Senn-Weinstein monofilament wire test within normal limits  bilaterally. Muscle power within normal limits bilaterally.  Nails Thick disfigured discolored nails with subungual debris  from hallux to fifth toes bilaterally. No evidence of bacterial infection or drainage bilaterally.  Orthopedic  No limitations of motion  feet .  No crepitus or effusions noted.  No bony pathology or digital deformities noted.  HAV  B/L.  Skin  normotropic skin with no porokeratosis noted bilaterally.  No signs of infections or ulcers noted.     Onychomycosis  Pain in right toes  Pain in left toes  Consent was obtained for treatment procedures.   Mechanical debridement of nails 1-5  bilaterally performed with a nail nipper.  Filed with dremel without incident.     Return office visit    3 months for RFC.                 Told patient to return for periodic foot care and evaluation due to potential at risk complications.   Helane Gunther DPM
